# Patient Record
Sex: Male | Born: 1985
Health system: Southern US, Community
[De-identification: ages and names within clinical notes are randomized; demographics above are authoritative.]

## PROBLEM LIST (undated history)

## (undated) DIAGNOSIS — E039 Hypothyroidism, unspecified: Secondary | ICD-10-CM

## (undated) HISTORY — DX: Hypothyroidism, unspecified: E03.9

## (undated) HISTORY — PX: HYPOSPADIAS CORRECTION: SHX483

## (undated) HISTORY — PX: MASS EXCISION: SHX2000

## (undated) HISTORY — PX: APPENDECTOMY: SHX54

---

## 2009-04-25 ENCOUNTER — Ambulatory Visit: Payer: Self-pay | Admitting: Otolaryngology

## 2009-08-24 ENCOUNTER — Ambulatory Visit: Payer: Self-pay | Admitting: Family Medicine

## 2009-08-24 ENCOUNTER — Encounter: Admission: RE | Admit: 2009-08-24 | Discharge: 2009-08-24 | Payer: Self-pay | Admitting: Family Medicine

## 2009-08-24 DIAGNOSIS — E039 Hypothyroidism, unspecified: Secondary | ICD-10-CM | POA: Insufficient documentation

## 2009-08-24 DIAGNOSIS — R599 Enlarged lymph nodes, unspecified: Secondary | ICD-10-CM | POA: Insufficient documentation

## 2009-08-24 LAB — CONVERTED CEMR LAB
HCT: 45.5 % (ref 39.0–52.0)
Lymphs Abs: 1.2 10*3/uL (ref 0.7–4.0)
MCHC: 35 g/dL (ref 30.0–36.0)
MCV: 86.6 fL (ref 78.0–100.0)
Monocytes Absolute: 0.4 10*3/uL (ref 0.1–1.0)
Neutrophils Relative %: 56.1 % (ref 43.0–77.0)
RBC: 5.26 M/uL (ref 4.22–5.81)
RDW: 11.3 % — ABNORMAL LOW (ref 11.5–14.6)
WBC: 3.7 10*3/uL — ABNORMAL LOW (ref 4.5–10.5)

## 2013-03-30 ENCOUNTER — Encounter: Payer: Self-pay | Admitting: Family Medicine

## 2013-03-30 ENCOUNTER — Ambulatory Visit (INDEPENDENT_AMBULATORY_CARE_PROVIDER_SITE_OTHER): Payer: 59 | Admitting: Family Medicine

## 2013-03-30 VITALS — BP 140/90 | HR 95 | Temp 97.8°F | Ht 73.0 in | Wt 190.5 lb

## 2013-03-30 DIAGNOSIS — E039 Hypothyroidism, unspecified: Secondary | ICD-10-CM | POA: Insufficient documentation

## 2013-03-30 DIAGNOSIS — Z23 Encounter for immunization: Secondary | ICD-10-CM

## 2013-03-30 NOTE — Progress Notes (Signed)
Nature conservation officer at [facility] [address] [place] [address] Phone: [phone] Fax: [phone]  Date:  03/30/2013   Name:  Allen Long   DOB:  [birth year]   MRN:  [id number] Gender: male Age: 27 y.o.  Primary Physician:  Hannah Beat, MD  Evaluating MD: Hannah Beat, MD   Chief Complaint: Establish Care   History of Present Illness:  Srihaan Mastrangelo is a 27 y.o. pleasant patient who presents with the following:  06/14/2013 - baby is due.   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   Sharyl Nimrod is wife.  From [place]. [employer] undergrad - wife works there.   Dr. Colbert Ewing at [facility] has been following hypothyroidism.  Had radioactive iodine testing and normal.   Tdap - now  Thyroid has been stable  Patient Active Problem List   Diagnosis Date Noted  . HYPOTHYROIDISM 08/24/2009  . ENLARGEMENT OF LYMPH NODES 08/24/2009    No past medical history on file.  No past surgical history on file.  History   Social History  . Marital Status: Married    Spouse Name: N/A    Number of Children: N/A  . Years of Education: N/A   Occupational History  . Not on file.   Social History Main Topics  . Smoking status: Never Smoker   . Smokeless tobacco: Not on file  . Alcohol Use: No  . Drug Use: No  . Sexually Active: Not on file   Other Topics Concern  . Not on file   Social History Narrative  . No narrative on file    No family history on file.  Allergies  Allergen Reactions  . Penicillins     REACTION: Rash as a child    Medication list has been reviewed and updated.  No outpatient prescriptions prior to visit.   No facility-administered medications prior to visit.    Review of Systems:   GEN: No acute illnesses, no fevers, chills. GI: No n/v/d, eating normally Pulm: No SOB Interactive and getting along well at home.  Otherwise, ROS is as per the HPI.   Physical Examination: BP 140/90  Pulse 95  Temp(Src) 97.8 F (36.6 C) (Oral)  Ht 6\' 1"   (1.854 m)  Wt 190 lb 8 oz (86.41 kg)  BMI 25.14 kg/m2  SpO2 96%  Ideal Body Weight: Weight in (lb) to have BMI = 25: 189.1   GEN: WDWN, NAD, Non-toxic, A & O x 3 HEENT: Atraumatic, Normocephalic. Neck supple. No masses, No LAD. Ears and Nose: No external deformity. CV: RRR, No M/G/R. No JVD. No thrill. No extra heart sounds. PULM: CTA B, no wheezes, crackles, rhonchi. No retractions. No resp. distress. No accessory muscle use. EXTR: No c/c/e NEURO Normal gait.  PSYCH: Normally interactive. Conversant. Not depressed or anxious appearing.  Calm demeanor.    Assessment and Plan:  Immunization due - Plan: Tdap vaccine greater than or equal to 7yo IM  HYPOTHYROIDISM  Hypothyroid  Tdap topday O/w doing well  Orders Today:  Orders Placed This Encounter  Procedures  . Tdap vaccine greater than or equal to 7yo IM    Updated Medication List: (Includes new medications, updates to list, dose adjustments) Meds ordered this encounter  Medications  . levothyroxine (SYNTHROID, LEVOTHROID) 175 MCG tablet    Sig: Take 175 mcg by mouth daily before breakfast.  . Multiple Vitamin (MULTIVITAMIN) tablet    Sig: Take 1 tablet by mouth daily.  Marland Kitchen loratadine (CLARITIN) 10 MG tablet    Sig: Take  10 mg by mouth daily.    Medications Discontinued: There are no discontinued medications.    Signed, Elpidio Galea. Armend Hochstatter, MD 03/30/2013 3:57 PM

## 2014-01-09 ENCOUNTER — Encounter: Payer: Self-pay | Admitting: Family Medicine

## 2014-01-09 ENCOUNTER — Ambulatory Visit (INDEPENDENT_AMBULATORY_CARE_PROVIDER_SITE_OTHER): Payer: 59 | Admitting: Family Medicine

## 2014-01-09 VITALS — BP 118/78 | HR 75 | Temp 97.8°F | Wt 178.5 lb

## 2014-01-09 DIAGNOSIS — J189 Pneumonia, unspecified organism: Secondary | ICD-10-CM

## 2014-01-09 MED ORDER — AZITHROMYCIN 250 MG PO TABS: ORAL_TABLET | ORAL | Status: DC

## 2014-01-09 NOTE — Progress Notes (Signed)
   Date:  01/09/2014   Name:  Allen Long   DOB:  [birth year]   MRN:  [id number] Gender: male Age: 28 y.o.  Primary Physician:  Owens Loffler, MD   Chief Complaint: Cough   Subjective:   History of Present Illness:  Allen Long is a 28 y.o. very pleasant male patient who presents with the following:  Cough for about 2 weeks, cough but no fever, dry cough, almost like a tickle in his throat. Off and on sick for 2 months. Newborn in daycare.  AF now. Taking nyquil.  Other than that, has felt fine. Felt a little bit. Took some nyquil.  Dr. Daisy Blossom checking thyroid.   Past Medical History, Surgical History, Social History, Family History, Problem List, Medications, and Allergies have been reviewed and updated if relevant.  Review of Systems: ROS: GEN: Acute illness details above GI: Tolerating PO intake GU: maintaining adequate hydration and urination Pulm: No SOB Interactive and getting along well at home.  Otherwise, ROS is as per the HPI.   Objective:   Physical Examination: BP 118/78  Pulse 75  Temp(Src) 97.8 F (36.6 C) (Oral)  Wt 178 lb 8 oz (80.967 kg)  SpO2 93%   GEN: A and O x 3. WDWN. NAD.    ENT: Nose clear, ext NML.  No LAD.  No JVD.  TM's clear. Oropharynx clear.  PULM: Normal WOB, no distress. No crackles, wheezes, rhonchi. CV: RRR, no M/G/R, No rubs, No JVD.   EXT: warm and well-perfused, No c/c/e. PSYCH: Pleasant and conversant.   Laboratory and Imaging Data:  Assessment & Plan:   Walking pneumonia  discussed plan of care. Given length of symptoms and overall history, will treat with ABX in this case. Continue with additional supportive care, cough medications, liquids, sleep, steam / vaporizer.   New Prescriptions   AZITHROMYCIN (ZITHROMAX Z-PAK) 250 MG TABLET    Take 2 tablets (500 mg) on  Day 1,  followed by 1 tablet (250 mg) once daily on Days 2 through 5.   No orders of the defined types were placed in this encounter.    Signed,  Allen Long. Maida Widger, MD, [place] at [facility] [place] [address] Phone: [phone] Fax: [phone]  There are no Patient Instructions on file for this visit. Patient's Medications  New Prescriptions   AZITHROMYCIN (ZITHROMAX Z-PAK) 250 MG TABLET    Take 2 tablets (500 mg) on  Day 1,  followed by 1 tablet (250 mg) once daily on Days 2 through 5.  Previous Medications   LEVOTHYROXINE (SYNTHROID, LEVOTHROID) 175 MCG TABLET    Take 175 mcg by mouth daily before breakfast.   LORATADINE (CLARITIN) 10 MG TABLET    Take 10 mg by mouth daily.   MULTIPLE VITAMIN (MULTIVITAMIN) TABLET    Take 1 tablet by mouth daily.  Modified Medications   No medications on file  Discontinued Medications   No medications on file

## 2014-01-09 NOTE — Progress Notes (Signed)
Pre-visit discussion using our clinic review tool. No additional management support is needed unless otherwise documented below in the visit note.  

## 2014-01-30 ENCOUNTER — Encounter: Payer: Self-pay | Admitting: Family Medicine

## 2014-01-30 ENCOUNTER — Ambulatory Visit (INDEPENDENT_AMBULATORY_CARE_PROVIDER_SITE_OTHER): Payer: 59 | Admitting: Family Medicine

## 2014-01-30 ENCOUNTER — Ambulatory Visit (INDEPENDENT_AMBULATORY_CARE_PROVIDER_SITE_OTHER)
Admission: RE | Admit: 2014-01-30 | Discharge: 2014-01-30 | Disposition: A | Discharge status: Home / Self Care | Payer: 59 | Source: Ambulatory Visit | Attending: Family Medicine | Admitting: Family Medicine

## 2014-01-30 VITALS — BP 124/72 | HR 82 | Temp 97.8°F | Ht 73.0 in | Wt 178.5 lb

## 2014-01-30 DIAGNOSIS — M20019 Mallet finger of unspecified finger(s): Secondary | ICD-10-CM

## 2014-01-30 DIAGNOSIS — M20012 Mallet finger of left finger(s): Secondary | ICD-10-CM

## 2014-01-30 NOTE — Progress Notes (Signed)
Pre visit review using our clinic review tool, if applicable. No additional management support is needed unless otherwise documented below in the visit note. 

## 2014-01-30 NOTE — Progress Notes (Signed)
Date:  01/30/2014   Name:  Allen Long   DOB:  10/23/1986   MRN:  [id number]  Primary Physician:  Owens Loffler, MD   Chief Complaint: Finger Injury   Subjective:   History of Present Illness:  Allen Long is a 28 y.o. pleasant patient who presents with the following:  L 4th digit. Healthy young man was playing kickball yesterday, when he sustained an injury to his LEFT 4th digit. At that time a basketball traumatized the end of his finger, and he was no longer able to extend at the DIP joint, consistent with mallet deformity. He had a prior mallet deformity a couple of years ago, so he placed himself in a volar aluminum form splint. He is having some minimal pain only. He is here for evaluation and consideration of definitive management.  No fracture  Patient Active Problem List   Diagnosis Date Noted  . Hypothyroid     Past Medical History  Diagnosis Date  . Hypothyroid     No past surgical history on file.  History   Social History  . Marital Status: Married    Spouse Name: [name]    Number of Children: N/A  . 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 Smoking status: Never Smoker   . Smokeless tobacco: Never Used  . Alcohol Use: No  . Drug Use: No  . Sexual Activity: Yes    Partners: Female   Other Topics Concern  . 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       [name] is wife.    From [place].   [employer] undergrad - wife works there.     No family history on file.  Allergies  Allergen Reactions  . Penicillins     REACTION: Rash as a child    Medication list has been reviewed and updated.  Review of Systems:  GEN: No fevers, chills. Nontoxic. Primarily MSK c/o today. MSK: Detailed in the HPI GI: tolerating PO intake without difficulty Neuro: No numbness, parasthesias, or tingling associated. Otherwise the pertinent positives of the ROS are noted  above.   Objective:   Physical Examination: BP 124/72  Pulse 82  Temp(Src) 97.8 F (36.6 C) (Oral)  Ht 6\' 1"  (1.854 m)  Wt 178 lb 8 oz (80.967 kg)  BMI 23.56 kg/m2  Ideal Body Weight: Weight in (lb) to have BMI = 25: 189.1   GEN: WDWN, NAD, Non-toxic, Alert & Oriented x 3 HEENT: Atraumatic, Normocephalic.  Ears and Nose: No external deformity. EXTR: No clubbing/cyanosis/edema NEURO: Normal gait.  PSYCH: Normally interactive. Conversant. Not depressed or anxious appearing.  Calm demeanor.    Upon removal of the splint on the patient's 4th finger on the LEFT, there is an obvious mallet deformity on the DIPs. Flexor tendon function is preserved. There is no significant tenderness along the shaft of the finger. Nontender throughout all metacarpals and wrist. The distal radius and ulna are also nontender. There is no significant bruising or swelling.  Dg Finger Ring Left  01/30/2014   CLINICAL DATA:  Possible mallet injury of the finger, evaluate for fracture  EXAM: LEFT RING FINGER 2+V  COMPARISON:  None.  FINDINGS: No fracture or dislocation. Joint spaces are preserved. No erosions. There is possible mild soft tissue swelling about the PIP joint. No radiopaque foreign body.  IMPRESSION: Mild soft tissue swelling about  the PIP joint without associated fracture or radiopaque foreign body.   Electronically Signed   By: Sandi Mariscal M.D.   On: 01/30/2014 17:12    Assessment & Plan:   Mallet finger of left hand - Plan: DG Finger Ring Left  Consistent with tenderness mallet finger. No fracture. Immobilization for 6-8 weeks. Followup in 4 weeks.  He was placed in a stack splint without difficulty, and this was wrapped with athletic tape for fixation.  Orders Placed This Encounter  Procedures  . DG Finger Ring Left   Patient's Medications  New Prescriptions   No medications on file  Previous Medications   LEVOTHYROXINE (SYNTHROID, LEVOTHROID) 175 MCG TABLET    Take 175 mcg by mouth  daily before breakfast.   LORATADINE (CLARITIN) 10 MG TABLET    Take 10 mg by mouth daily.   MULTIPLE VITAMIN (MULTIVITAMIN) TABLET    Take 1 tablet by mouth daily.  Modified Medications   No medications on file  Discontinued Medications   AZITHROMYCIN (ZITHROMAX Z-PAK) 250 MG TABLET    Take 2 tablets (500 mg) on  Day 1,  followed by 1 tablet (250 mg) once daily on Days 2 through 5.   Patient Instructions  F/u 1 month   Signed,  Henretter Piekarski T. Asiah Befort, MD, [place] at [facility] [place] [address] Phone: [phone] Fax: [phone]

## 2014-01-30 NOTE — Patient Instructions (Signed)
F/u 1 month 

## 2014-02-24 ENCOUNTER — Telehealth: Payer: Self-pay

## 2014-02-24 NOTE — Telephone Encounter (Signed)
Pt left v/m; pt will be traveling to [place] in June; pt has appt already scheduled with Dr Lorelei Pont on 03/02/14 and pt wants to know if can get Hep A and typhoid med at that time. Pt request cb.

## 2014-02-27 NOTE — Telephone Encounter (Signed)
Called [name] and recommend that he go to the [facility] at [facility] to get vaccines he needs for his upcoming trip to [place].  Phone number [phone]) given to call and schedule appointment.

## 2014-02-27 NOTE — Telephone Encounter (Signed)
Butch Penny:  Hep a, no problem.   We will have to order Typhoid. Why don't we reschedule to next week to be sure it is in.    Dee, can you order him a Typhoid vaccine.  Owens Loffler, MD

## 2014-03-02 ENCOUNTER — Encounter: Payer: Self-pay | Admitting: Family Medicine

## 2014-03-02 ENCOUNTER — Ambulatory Visit (INDEPENDENT_AMBULATORY_CARE_PROVIDER_SITE_OTHER): Payer: 59 | Admitting: Family Medicine

## 2014-03-02 VITALS — BP 138/82 | HR 64 | Temp 97.5°F | Ht 73.0 in | Wt 177.5 lb

## 2014-03-02 DIAGNOSIS — M20019 Mallet finger of unspecified finger(s): Secondary | ICD-10-CM

## 2014-03-02 DIAGNOSIS — Z23 Encounter for immunization: Secondary | ICD-10-CM

## 2014-03-02 DIAGNOSIS — M20012 Mallet finger of left finger(s): Secondary | ICD-10-CM

## 2014-03-02 NOTE — Progress Notes (Signed)
Pre visit review using our clinic review tool, if applicable. No additional management support is needed unless otherwise documented below in the visit note. 

## 2014-03-02 NOTE — Progress Notes (Signed)
Date:  03/02/2014   Name:  Allen Long   DOB:  04/25/1986   MRN:  [id number]  Primary Physician:  Owens Loffler, MD   Chief Complaint: Follow-up and Immunizations   Subjective:   History of Present Illness:  Allen Long is a 28 y.o. pleasant patient who presents with the following:  4 week f/u, wearing aluminum splint and doing well. No complications.  01/30/2014 Last OV with Owens Loffler, MD  L 4th digit. Healthy young man was playing kickball yesterday, when he sustained an injury to his LEFT 4th digit. At that time a basketball traumatized the end of his finger, and he was no longer able to extend at the DIP joint, consistent with mallet deformity. He had a prior mallet deformity a couple of years ago, so he placed himself in a volar aluminum form splint. He is having some minimal pain only. He is here for evaluation and consideration of definitive management.  No fracture  Patient Active Problem List   Diagnosis Date Noted  . Hypothyroid     Past Medical History  Diagnosis Date  . Hypothyroid     No past surgical history on file.  History   Social History  . Marital Status: Married    Spouse Name: [name]    Number of Children: N/A  . 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 Smoking status: Never Smoker   . Smokeless tobacco: Never Used  . Alcohol Use: No  . Drug Use: No  . Sexual Activity: Yes    Partners: Female   Other Topics Concern  . 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       [name] is wife.    From [place].   [employer] undergrad - wife works there.     No family history on file.  Allergies  Allergen Reactions  . Penicillins     REACTION: Rash as a child    Medication list has been reviewed and updated.  Review of Systems:  GEN: No fevers, chills. Nontoxic. Primarily MSK c/o today. MSK: Detailed in the HPI GI: tolerating PO intake  without difficulty Neuro: No numbness, parasthesias, or tingling associated. Otherwise the pertinent positives of the ROS are noted above.   Objective:   Physical Examination: BP 138/82  Pulse 64  Temp(Src) 97.5 F (36.4 C) (Oral)  Ht 6\' 1"  (1.854 m)  Wt 177 lb 8 oz (80.513 kg)  BMI 23.42 kg/m2  SpO2 98%  Ideal Body Weight: Weight in (lb) to have BMI = 25: 189.1   GEN: WDWN, NAD, Non-toxic, Alert & Oriented x 3 HEENT: Atraumatic, Normocephalic.  Ears and Nose: No external deformity. EXTR: No clubbing/cyanosis/edema NEURO: Normal gait.  PSYCH: Normally interactive. Conversant. Not depressed or anxious appearing.  Calm demeanor.    Upon removal of the splint on the patient's 4th finger on the LEFT - nontender now. There is no significant bruising or swelling.  Dg Finger Ring Left  01/30/2014   CLINICAL DATA:  Possible mallet injury of the finger, evaluate for fracture  EXAM: LEFT RING FINGER 2+V  COMPARISON:  None.  FINDINGS: No fracture or dislocation. Joint spaces are preserved. No erosions. There is possible mild soft tissue swelling about the PIP joint. No radiopaque foreign body.  IMPRESSION: Mild soft tissue swelling about the PIP joint without associated fracture or radiopaque foreign body.  Electronically Signed   By: Sandi Mariscal M.D.   On: 01/30/2014 17:12    Assessment & Plan:   Mallet finger of left hand  Need for hepatitis A immunization - Plan: Hepatitis A vaccine adult IM  Consistent with healing mallet finger. No fracture. Immobilization for 8 weeks.  4 weeks more, then remove splint.  Orders Placed This Encounter  Procedures  . Hepatitis A vaccine adult IM   Patient's Medications  New Prescriptions   No medications on file  Previous Medications   LEVOTHYROXINE (SYNTHROID, LEVOTHROID) 175 MCG TABLET    Take 175 mcg by mouth daily before breakfast.   LORATADINE (CLARITIN) 10 MG TABLET    Take 10 mg by mouth daily.   MULTIPLE VITAMIN (MULTIVITAMIN)  TABLET    Take 1 tablet by mouth daily.  Modified Medications   No medications on file  Discontinued Medications   No medications on file   There are no Patient Instructions on file for this visit.  Signed,  Maud Deed. Nataliyah Packham, MD, [place] at [facility] [place] [address] Phone: [phone] Fax: [phone]

## 2014-06-19 ENCOUNTER — Telehealth: Payer: Self-pay

## 2014-06-19 NOTE — Telephone Encounter (Signed)
Pt left v/m; pt had baby last year and baby tested positive for CF. Turned out baby did not have CF but pt wants to get genetic testing to see if would be issue going forward. Pt request an order for inherit test carrier screeninig ;pt is employee with [employer]. Pt would like to have test done at [facility].pt request cb.

## 2014-06-20 NOTE — Telephone Encounter (Signed)
Cystic Fibrosis (CF) Profile, 32 Mutations, DNA Analysis IS WHAT WAS RECCOMENDED I ALSO HAVE A FORM THEY NEED TO FILL OUT. BOTH PARENTS NEED TO DO THE TESTING.

## 2014-06-20 NOTE — Telephone Encounter (Signed)
I will look at this in the AM.   Not simple lab order.

## 2014-06-20 NOTE — Telephone Encounter (Signed)
Terri, can you find the proper [employer] test for CF gene testing as above? We may have to call [employer] to double check.

## 2014-06-21 NOTE — Telephone Encounter (Signed)
I can either give the patient an order, and he can have it done at [place] draw site, or he can come here.  There is a one-page paperwork that he needs to fill out. It is also recommended that his wife get CF testing at the same time by her doctor.  Either way ok. If comes here, we can set up a lab appt.

## 2014-06-22 ENCOUNTER — Telehealth: Payer: Self-pay | Admitting: Family Medicine

## 2014-06-22 NOTE — Telephone Encounter (Signed)
Returned [employer].  See previous phone note.

## 2014-06-22 NOTE — Telephone Encounter (Signed)
Ashten notified as instructed by telephone.  He states his wife has already been tested.  He would like to come here to have test drawn.  Appointment scheduled for lab on 06/27/2014 @ 11:45am.

## 2014-06-22 NOTE — Telephone Encounter (Signed)
Pt is returning your call. Please call him back. Thank you.

## 2014-06-27 ENCOUNTER — Other Ambulatory Visit (INDEPENDENT_AMBULATORY_CARE_PROVIDER_SITE_OTHER): Payer: 59

## 2014-06-27 DIAGNOSIS — Z13228 Encounter for screening for other metabolic disorders: Secondary | ICD-10-CM

## 2014-07-02 NOTE — Progress Notes (Signed)
Did this ever get done?

## 2014-07-11 LAB — INHERITEST CARRIER SCREEN: PDF: 0

## 2014-07-12 ENCOUNTER — Telehealth: Payer: Self-pay | Admitting: Family Medicine

## 2014-07-12 NOTE — Telephone Encounter (Signed)
Patient returned your call.

## 2014-07-12 NOTE — Telephone Encounter (Signed)
Allen Long notified of lab results by telephone.  See Result Notes.

## 2014-07-20 ENCOUNTER — Encounter: Payer: Self-pay | Admitting: Family Medicine

## 2014-08-31 ENCOUNTER — Ambulatory Visit: Payer: 59

## 2014-09-05 ENCOUNTER — Ambulatory Visit (INDEPENDENT_AMBULATORY_CARE_PROVIDER_SITE_OTHER): Payer: 59

## 2014-09-05 DIAGNOSIS — Z23 Encounter for immunization: Secondary | ICD-10-CM

## 2015-01-08 ENCOUNTER — Ambulatory Visit (INDEPENDENT_AMBULATORY_CARE_PROVIDER_SITE_OTHER): Payer: 59 | Admitting: Internal Medicine

## 2015-01-08 ENCOUNTER — Encounter: Payer: Self-pay | Admitting: Internal Medicine

## 2015-01-08 VITALS — BP 126/78 | HR 68 | Temp 98.0°F | Wt 181.5 lb

## 2015-01-08 DIAGNOSIS — J029 Acute pharyngitis, unspecified: Secondary | ICD-10-CM

## 2015-01-08 DIAGNOSIS — J020 Streptococcal pharyngitis: Secondary | ICD-10-CM

## 2015-01-08 MED ORDER — CLINDAMYCIN HCL 300 MG PO CAPS: 300.0000 mg | ORAL_CAPSULE | Freq: Three times a day (TID) | ORAL | Status: DC

## 2015-01-08 NOTE — Progress Notes (Signed)
Subjective:    Patient ID: Allen Long, male    DOB: November 12, 1986, 29 y.o.   MRN: [id number]  HPI  Pt presents to the clinic today with c/o sore throat. He reports this started 3 days ago. He has had difficulty swallowing. He denies fever, chills or body aches. He has tried warm salt water gargles, Claritin and Dayquil with some relief. He has no history of allergies or breathing problems. He has had contacts with similar symptoms.  Review of Systems      Past Medical History  Diagnosis Date  . Hypothyroid     Current Outpatient Prescriptions  Medication Sig Dispense Refill  . levothyroxine (SYNTHROID, LEVOTHROID) 175 MCG tablet Take 175 mcg by mouth daily before breakfast.    . loratadine (CLARITIN) 10 MG tablet Take 10 mg by mouth daily.    . Multiple Vitamin (MULTIVITAMIN) tablet Take 1 tablet by mouth daily.     No current facility-administered medications for this visit.    Allergies  Allergen Reactions  . Penicillins     REACTION: Rash as a child    History reviewed. No pertinent family history.  History   Social History  . Marital Status: Married    Spouse Name: [name]  . Number of Children: N/A  . 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 Smoking status: Never Smoker   . Smokeless tobacco: Never Used  . Alcohol Use: No  . Drug Use: No  . Sexual Activity:    Partners: Female   Other Topics Concern  . 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       [name] is wife.    From [place].   [employer] undergrad - wife works there.      Constitutional: Denies fever, malaise, fatigue, headache or abrupt weight changes.  HEENT: Pt reports sore throat. Denies eye pain, eye redness, ear pain, ringing in the ears, wax buildup, runny nose, nasal congestion, bloody nose. Respiratory: Denies difficulty breathing, shortness of breath, cough or sputum production.     Cardiovascular: Denies chest pain, chest tightness, palpitations or swelling in the hands or feet.  Gastrointestinal: Denies abdominal pain, bloating, constipation, diarrhea or blood in the stool.    No other specific complaints in a complete review of systems (except as listed in HPI above).  Objective:   Physical Exam    BP 126/78 mmHg  Pulse 68  Temp(Src) 98 F (36.7 C) (Oral)  Wt 181 lb 8 oz (82.328 kg)  SpO2 98% Wt Readings from Last 3 Encounters:  01/08/15 181 lb 8 oz (82.328 kg)  03/02/14 177 lb 8 oz (80.513 kg)  01/30/14 178 lb 8 oz (80.967 kg)    General: Appears his stated age, well developed, well nourished in NAD. Skin: Warm, dry and intact. No rashes, lesions or ulcerations noted. HEENT: Head: normal shape and size; Ears: Tm's gray and intact, normal light reflex; Throat/Mouth: Teeth present, mucosa erythematous and moist, no exudate, lesions or ulcerations noted.  Neck: No adenopathy noted. Cardiovascular: Normal rate and rhythm. S1,S2 noted.  No murmur, rubs or gallops noted.  Pulmonary/Chest: Normal effort and positive vesicular breath sounds. No respiratory distress. No wheezes, rales or ronchi noted.   CBC    Component Value Date/Time   WBC 3.7* 08/24/2009 1009   RBC 5.26 08/24/2009 1009   HGB 15.9 08/24/2009 1009  HCT 45.5 08/24/2009 1009   PLT 180.0 08/24/2009 1009   MCV 86.6 08/24/2009 1009   MCHC 35.0 08/24/2009 1009   RDW 11.3* 08/24/2009 1009   LYMPHSABS 1.2 08/24/2009 1009   MONOABS 0.4 08/24/2009 1009   EOSABS 0.1 08/24/2009 1009   BASOSABS 0.0 08/24/2009 1009        Assessment & Plan:   Sore Throat:  RST: Positive Allergy to PCN, RX given for Clindamycin 300 mg TID x 10 days Ibuprofen and salt water gargles for the sore throat  RTC as needed or if symptoms persist or worsen

## 2015-01-08 NOTE — Progress Notes (Signed)
   Subjective:    Patient ID: Allen Long, male    DOB: 1986-04-22, 29 y.o.   MRN: [id number]  Sore Throat  Pertinent negatives include no congestion, coughing, neck pain or shortness of breath.   Allen Long is a 29 year old male who presents today with chief complaint of sore throat.  Symptoms began 3 days ago and says he has had some difficulty swallowing. He has tried OTC Dayquil and Claritin with some relief. He denies cough, post nasal drip, congestion, and fever.  No chills or malaise.  His wife is sick with the same symptoms as well.    Review of Systems  Constitutional: Negative for fever, chills and fatigue.  HENT: Negative for congestion, postnasal drip and rhinorrhea.   Respiratory: Negative for cough, shortness of breath and wheezing.   Cardiovascular: Negative for chest pain, palpitations and leg swelling.  Musculoskeletal: Negative for arthralgias, neck pain and neck stiffness.  Skin: Negative for color change, pallor, rash and wound.   Past Medical History  Diagnosis Date  . Hypothyroid    History reviewed. No pertinent family history. Current Outpatient Prescriptions on File Prior to Visit  Medication Sig Dispense Refill  . levothyroxine (SYNTHROID, LEVOTHROID) 175 MCG tablet Take 175 mcg by mouth daily before breakfast.    . loratadine (CLARITIN) 10 MG tablet Take 10 mg by mouth daily.    . Multiple Vitamin (MULTIVITAMIN) tablet Take 1 tablet by mouth daily.     No current facility-administered medications on file prior to visit.       Objective:   Physical Exam  Constitutional: He is oriented to person, place, and time. He appears well-developed and well-nourished.  HENT:  Head: Normocephalic and atraumatic.  Mouth/Throat: Posterior oropharyngeal erythema present.  Tonsils are 2+ and erythematous.   Neck: Normal range of motion. Neck supple.  Cardiovascular: Normal rate, regular rhythm and normal heart sounds.   No murmur heard. Pulmonary/Chest: Effort  normal and breath sounds normal. He has no wheezes.  Musculoskeletal: Normal range of motion.  Lymphadenopathy:    He has no cervical adenopathy.  Neurological: He is alert and oriented to person, place, and time.  Skin: Skin is warm and dry.  Psychiatric: He has a normal mood and affect.    BP 126/78 mmHg  Pulse 68  Temp(Src) 98 F (36.7 C) (Oral)  Wt 181 lb 8 oz (82.328 kg)  SpO2 98%       Assessment & Plan:  1. Strep pharyngitis - In office strep test is positive.  Will Rx for Clindamycin 300 mg po tid for 10 days.  Advised patient to complete entire regimen of antibiotics and not to stop early.  Call office if symptoms worsen or do not improve.

## 2015-01-08 NOTE — Progress Notes (Signed)
Pre visit review using our clinic review tool, if applicable. No additional management support is needed unless otherwise documented below in the visit note. 

## 2015-01-08 NOTE — Patient Instructions (Signed)

## 2015-01-09 LAB — POCT RAPID STREP A (OFFICE): Rapid Strep A Screen: POSITIVE — AB

## 2015-01-09 NOTE — Addendum Note (Signed)
Addended by: Lurlean Nanny on: 01/09/2015 08:19 AM   Modules accepted: Orders

## 2015-07-09 ENCOUNTER — Ambulatory Visit (HOSPITAL_COMMUNITY)
Admission: RE | Admit: 2015-07-09 | Discharge: 2015-07-09 | Disposition: A | Discharge status: Home / Self Care | Payer: 59 | Source: Ambulatory Visit | Attending: Obstetrics & Gynecology | Admitting: Obstetrics & Gynecology

## 2015-07-09 DIAGNOSIS — Z1379 Encounter for other screening for genetic and chromosomal anomalies: Secondary | ICD-10-CM | POA: Diagnosis not present

## 2015-07-20 ENCOUNTER — Other Ambulatory Visit (HOSPITAL_COMMUNITY): Payer: Self-pay | Admitting: Obstetrics & Gynecology

## 2015-08-22 ENCOUNTER — Ambulatory Visit (INDEPENDENT_AMBULATORY_CARE_PROVIDER_SITE_OTHER): Payer: 59 | Admitting: Internal Medicine

## 2015-08-22 ENCOUNTER — Encounter: Payer: Self-pay | Admitting: Internal Medicine

## 2015-08-22 VITALS — BP 134/82 | HR 70 | Temp 98.1°F | Wt 182.0 lb

## 2015-08-22 DIAGNOSIS — R0982 Postnasal drip: Secondary | ICD-10-CM

## 2015-08-22 DIAGNOSIS — R05 Cough: Secondary | ICD-10-CM | POA: Diagnosis not present

## 2015-08-22 DIAGNOSIS — R059 Cough, unspecified: Secondary | ICD-10-CM

## 2015-08-22 NOTE — Patient Instructions (Signed)
Allergic Rhinitis Allergic rhinitis is when the mucous membranes in the nose respond to allergens. Allergens are particles in the air that cause your body to have an allergic reaction. This causes you to release allergic antibodies. Through a chain of events, these eventually cause you to release histamine into the blood stream. Although meant to protect the body, it is this release of histamine that causes your discomfort, such as frequent sneezing, congestion, and an itchy, runny nose.  CAUSES Seasonal allergic rhinitis (hay fever) is caused by pollen allergens that may come from grasses, trees, and weeds. Year-round allergic rhinitis (perennial allergic rhinitis) is caused by allergens such as house dust mites, pet dander, and mold spores. SYMPTOMS  Nasal stuffiness (congestion).  Itchy, runny nose with sneezing and tearing of the eyes. DIAGNOSIS Your health care provider can help you determine the allergen or allergens that trigger your symptoms. If you and your health care provider are unable to determine the allergen, skin or blood testing may be used. Your health care provider will diagnose your condition after taking your health history and performing a physical exam. Your health care provider may assess you for other related conditions, such as asthma, pink eye, or an ear infection. TREATMENT Allergic rhinitis does not have a cure, but it can be controlled by:  Medicines that block allergy symptoms. These may include allergy shots, nasal sprays, and oral antihistamines.  Avoiding the allergen. Hay fever may often be treated with antihistamines in pill or nasal spray forms. Antihistamines block the effects of histamine. There are over-the-counter medicines that may help with nasal congestion and swelling around the eyes. Check with your health care provider before taking or giving this medicine. If avoiding the allergen or the medicine prescribed do not work, there are many new medicines  your health care provider can prescribe. Stronger medicine may be used if initial measures are ineffective. Desensitizing injections can be used if medicine and avoidance does not work. Desensitization is when a patient is given ongoing shots until the body becomes less sensitive to the allergen. Make sure you follow up with your health care provider if problems continue. HOME CARE INSTRUCTIONS It is not possible to completely avoid allergens, but you can reduce your symptoms by taking steps to limit your exposure to them. It helps to know exactly what you are allergic to so that you can avoid your specific triggers. SEEK MEDICAL CARE IF:  You have a fever.  You develop a cough that does not stop easily (persistent).  You have shortness of breath.  You start wheezing.  Symptoms interfere with normal daily activities.   This information is not intended to replace advice given to you by your health care provider. Make sure you discuss any questions you have with your health care provider.   Document Released: 07/29/2001 Document Revised: 11/24/2014 Document Reviewed: 07/11/2013 Elsevier Interactive Patient Education 2016 Elsevier Inc.  

## 2015-08-22 NOTE — Progress Notes (Signed)
HPI  Pt presents to the clinic today with c/o facial pressure and cough. This started 2 weeks ago. He denies runny nose, nasal congestion or sore throat. The cough is nonproductive. He denies fever, chills, body aches or shortness of breath. He has not tried anything OTC. He does have a history of allergies, and takes Claritin prn (but has not taken recently). He has had sick contacts.   Review of Systems      Past Medical History  Diagnosis Date  . Hypothyroid     No family history on file.  Social History   Social History  . Marital Status: Married    Spouse Name: [name]  . Number of Children: N/A  . 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 Smoking status: Never Smoker   . Smokeless tobacco: Never Used  . Alcohol Use: No  . Drug Use: No  . Sexual Activity:    Partners: Female   Other Topics Concern  . 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       [name] is wife.    From [place].   [employer] undergrad - wife works there.     Allergies  Allergen Reactions  . Penicillins     REACTION: Rash as a child     Constitutional: Denies headache, fatigue, fever or abrupt weight changes.  HEENT: Pt reports facial pain.  Denies eye redness, eye pain, pressure behind the eyes, nasal congestion, ear pain, ringing in the ears, wax buildup, runny nose or bloody nose. Respiratory: Positive cough. Denies difficulty breathing or shortness of breath.  Cardiovascular: Denies chest pain, chest tightness, palpitations or swelling in the hands or feet.   No other specific complaints in a complete review of systems (except as listed in HPI above).  Objective:   BP 134/82 mmHg  Pulse 70  Temp(Src) 98.1 F (36.7 C) (Oral)  Wt 182 lb (82.555 kg)  SpO2 98%  Wt Readings from Last 3 Encounters:  08/22/15 182 lb (82.555 kg)  01/08/15 181 lb 8 oz (82.328 kg)  03/02/14 177 lb 8 oz (80.513  kg)     General: Appears his stated age, well developed, well nourished in NAD. HEENT: Head: normal shape and size, no sinus tenderness noted; Eyes: sclera white, no icterus, conjunctiva pink; Ears: Tm's gray and intact, normal light reflex; Nose: mucosa pink and moist, septum midline; Throat/Mouth: + PND. Teeth present, mucosa pink and moist, no exudate noted, no lesions or ulcerations noted.  Neck: Cervical lymphadenopathy noted.  Cardiovascular: Normal rate and rhythm. S1,S2 noted.  No murmur, rubs or gallops noted.  Pulmonary/Chest: Normal effort and positive vesicular breath sounds. No respiratory distress. No wheezes, rales or ronchi noted.      Assessment & Plan:   Cough secondary to post nasal drip:  Start Zyrtec at night x 1 week Start Flonase in the morning x 1 week Delsym as needed for cough Return precautions given  RTC as needed or if symptoms persist.

## 2015-08-22 NOTE — Progress Notes (Signed)
Pre visit review using our clinic review tool, if applicable. No additional management support is needed unless otherwise documented below in the visit note. 

## 2015-09-26 ENCOUNTER — Ambulatory Visit (INDEPENDENT_AMBULATORY_CARE_PROVIDER_SITE_OTHER): Payer: 59 | Admitting: Family Medicine

## 2015-09-26 ENCOUNTER — Encounter: Payer: Self-pay | Admitting: Family Medicine

## 2015-09-26 VITALS — BP 138/86 | HR 80 | Temp 98.6°F | Ht 72.5 in | Wt 180.0 lb

## 2015-09-26 DIAGNOSIS — J189 Pneumonia, unspecified organism: Secondary | ICD-10-CM | POA: Diagnosis not present

## 2015-09-26 MED ORDER — AZITHROMYCIN 250 MG PO TABS: ORAL_TABLET | ORAL | Status: DC

## 2015-09-26 NOTE — Progress Notes (Signed)
Dr. Frederico Hamman T. Bodin Gorka, MD, [facility] [place], [address] Phone: [phone] Fax: [phone]  09/26/2015  Patient: Allen Long, MRN: [id number], DOB: 06-16-1986, 29 y.o.  Primary Physician:  Owens Loffler, MD   Chief Complaint  Patient presents with  . Cough    x 1 month   Subjective:   Allen Long is a 29 y.o. very pleasant male patient who presents with the following:  Patient presents for presents evaluation of cough x 4 week ago and are gradually worsening since that time.    Coughing for over a month.  2-3 weeks ago, tried some allergy medication, and tht did not help.  Tried some nasonex in the morning.   Has had some wheeze and not able to fully clear it.   Risk Factors:  The patient denies significant nausea, vomitting, diarrhea, rash, diffuse arthralgia or myalgia. They also deny high fever.   Past Medical History, Surgical History, Social History, Family History, Problem List, Medications, and Allergies have been reviewed and updated if relevant.  Patient Active Problem List   Diagnosis Date Noted  . Hypothyroid     Past Medical History  Diagnosis Date  . Hypothyroid     No past surgical history on file.  Social History   Social History  . Marital Status: Married    Spouse Name: Allen Long  . Number of Children: N/A  . 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 Smoking status: Never Smoker   . Smokeless tobacco: Never Used  . Alcohol Use: No  . Drug Use: No  . Sexual Activity:    Partners: Female   Other Topics Concern  . 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       Allen Long is wife.    From [place].   [employer] undergrad - wife works there.     No family history on file.  Allergies  Allergen Reactions  . Penicillins     REACTION: Rash as a child    Medication list  reviewed and updated in full in [facility].  ROS: GEN: Acute illness details above GI: Tolerating PO intake GU: maintaining adequate hydration and urination Pulm: No SOB Interactive and getting along well at home.  Otherwise, ROS is as per the HPI.   Objective:   BP 138/86 mmHg  Pulse 80  Temp(Src) 98.6 F (37 C) (Oral)  Ht 6' 0.5" (1.842 m)  Wt 180 lb (81.647 kg)  BMI 24.06 kg/m2  SpO2 97%   GEN: A and O x 3. WDWN. NAD.    ENT: Nose clear, ext NML.  No LAD.  No JVD.  TM's clear. Oropharynx clear.  PULM: Normal WOB, no distress. No crackles, wheezes, rhonchi. CV: RRR, no M/G/R, No rubs, No JVD.   EXT: warm and well-perfused, No c/c/e. PSYCH: Pleasant and conversant.   Laboratory and Imaging Data: No results found.  Assessment and Plan:   Walking pneumonia  At this point, we reviewed supportive care. Given the length of time and risk factors, treat with medications below. 1 mo, more likely atypical, cannot exclude pertussis  Medical decision making includes all plans, orders, medications, and patient instructions reviewed face to face.   Follow-up: No Follow-up on file.  New Prescriptions   AZITHROMYCIN (ZITHROMAX) 250 MG TABLET  2 tabs po on day 1, then 1 tab po for 4 days   Modified Medications   No medications on file   No orders of the defined types were placed in this encounter.    Signed,  Maud Deed. Zanaya Baize, MD   Patient's Medications  New Prescriptions   AZITHROMYCIN (ZITHROMAX) 250 MG TABLET    2 tabs po on day 1, then 1 tab po for 4 days  Previous Medications   LEVOTHYROXINE (SYNTHROID, LEVOTHROID) 175 MCG TABLET    Take 175 mcg by mouth daily before breakfast.   LORATADINE (CLARITIN) 10 MG TABLET    Take 10 mg by mouth daily.   MULTIPLE VITAMIN (MULTIVITAMIN) TABLET    Take 1 tablet by mouth daily.  Modified Medications   No medications on file  Discontinued Medications   No medications on file

## 2015-09-26 NOTE — Progress Notes (Signed)
Pre visit review using our clinic review tool, if applicable. No additional management support is needed unless otherwise documented below in the visit note. 

## 2016-02-11 ENCOUNTER — Other Ambulatory Visit: Payer: Self-pay

## 2016-02-12 LAB — TSH+T4F+T3FREE
FREE T4: 1.68 ng/dL (ref 0.82–1.77)
T3, Free: 3.2 pg/mL (ref 2.0–4.4)
TSH: 1.61 u[IU]/mL (ref 0.450–4.500)

## 2016-07-25 DIAGNOSIS — E039 Hypothyroidism, unspecified: Secondary | ICD-10-CM | POA: Diagnosis not present

## 2016-07-30 DIAGNOSIS — E039 Hypothyroidism, unspecified: Secondary | ICD-10-CM | POA: Diagnosis not present

## 2016-07-30 DIAGNOSIS — E041 Nontoxic single thyroid nodule: Secondary | ICD-10-CM | POA: Diagnosis not present

## 2016-09-25 DIAGNOSIS — Z1283 Encounter for screening for malignant neoplasm of skin: Secondary | ICD-10-CM | POA: Diagnosis not present

## 2016-09-25 DIAGNOSIS — D1800 Hemangioma unspecified site: Secondary | ICD-10-CM | POA: Diagnosis not present

## 2016-09-25 DIAGNOSIS — D229 Melanocytic nevi, unspecified: Secondary | ICD-10-CM | POA: Diagnosis not present

## 2016-09-25 DIAGNOSIS — D485 Neoplasm of uncertain behavior of skin: Secondary | ICD-10-CM | POA: Diagnosis not present

## 2016-09-30 ENCOUNTER — Ambulatory Visit (INDEPENDENT_AMBULATORY_CARE_PROVIDER_SITE_OTHER): Payer: BLUE CROSS/BLUE SHIELD | Admitting: Primary Care

## 2016-09-30 ENCOUNTER — Encounter: Payer: Self-pay | Admitting: Primary Care

## 2016-09-30 VITALS — BP 124/84 | HR 74 | Temp 98.1°F | Ht 72.5 in | Wt 183.1 lb

## 2016-09-30 DIAGNOSIS — J069 Acute upper respiratory infection, unspecified: Secondary | ICD-10-CM

## 2016-09-30 MED ORDER — AZITHROMYCIN 250 MG PO TABS: ORAL_TABLET | ORAL | 0 refills | Status: DC

## 2016-09-30 MED ORDER — BENZONATATE 200 MG PO CAPS: 200.0000 mg | ORAL_CAPSULE | Freq: Three times a day (TID) | ORAL | 0 refills | Status: DC | PRN

## 2016-09-30 NOTE — Progress Notes (Signed)
Subjective:    Patient ID: Allen Long, male    DOB: 1986-01-30, 30 y.o.   MRN: [id number]  HPI  Ms. Archilla is a 30 year old male with a history of "walking pneumonia" who presents today with a chief complaint of cough. He also reports rhinorrhea, fatigue, congestion. His cough has been present for the past 2 weeks and is dry in nature. Over the past 24 hours he's beginning to feel worse. He denies fevers, nausea, sore throat. He's been around his 2 small children who've had similar symptoms but have recovered. He's taken Nyquil at night with tempoary improvement.   Review of Systems  Constitutional: Positive for chills and fatigue. Negative for fever.  HENT: Positive for congestion and sore throat. Negative for ear pain, postnasal drip and sinus pressure.   Respiratory: Positive for cough. Negative for shortness of breath and wheezing.   Cardiovascular: Negative for chest pain.       Past Medical History:  Diagnosis Date  . Hypothyroid      Social History   Social History  . Marital status: Married    Spouse name: [name]  . Number of children: N/A  . Years of education: N/A   Occupational History  . 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 Smoking status: Never Smoker  . Smokeless tobacco: Never Used  . Alcohol use No  . Drug use: No  . Sexual activity: Yes    Partners: Female   Other Topics Concern  . 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       [name] is wife.    From [place].   [employer] undergrad - wife works there.     No past surgical history on file.  No family history on file.  Allergies  Allergen Reactions  . Penicillins     REACTION: Rash as a child    Current Outpatient Prescriptions on File Prior to Visit  Medication Sig Dispense Refill  . levothyroxine (SYNTHROID, LEVOTHROID) 175 MCG tablet Take 175 mcg by mouth daily before breakfast.    . loratadine (CLARITIN) 10 MG tablet Take 10 mg by mouth  daily.    . Multiple Vitamin (MULTIVITAMIN) tablet Take 1 tablet by mouth daily.     No current facility-administered medications on file prior to visit.     BP 124/84   Pulse 74   Temp 98.1 F (36.7 C) (Oral)   Ht 6' 0.5" (1.842 m)   Wt 183 lb 1.9 oz (83.1 kg)   SpO2 97%   BMI 24.49 kg/m    Objective:   Physical Exam  Constitutional: He appears well-nourished. He appears ill.  HENT:  Right Ear: Tympanic membrane and ear canal normal.  Left Ear: Tympanic membrane and ear canal normal.  Nose: No mucosal edema. Right sinus exhibits no maxillary sinus tenderness and no frontal sinus tenderness. Left sinus exhibits no maxillary sinus tenderness and no frontal sinus tenderness.  Mouth/Throat: Oropharynx is clear and moist.  Eyes: Conjunctivae are normal.  Neck: Neck supple.  Cardiovascular: Normal rate and regular rhythm.   Pulmonary/Chest: Effort normal and breath sounds normal. He has no wheezes. He has no rales.  Skin: Skin is warm and dry.          Assessment & Plan:  URI:  Cough, congestion, fatigue x 2 weeks. Started feeling worse yesterday. Exam today with clear lungs but does appear acutely ill. Given duration  of symptoms, history of pneumonia, and presentation will treat. Rx for Zpak and Tessalon pearls sent to pharmacy for treatment.  Fluids, rest, follow up PRN.  Sheral Flow, NP

## 2016-09-30 NOTE — Progress Notes (Signed)
Pre visit review using our clinic review tool, if applicable. No additional management support is needed unless otherwise documented below in the visit note. 

## 2016-09-30 NOTE — Patient Instructions (Signed)
Start Azithromycin antibiotics. Take 2 tablets by mouth today, then 1 tablet daily for 4 additional days.  You may take Benzonatate capsules for cough. Take 1 capsule by mouth three times daily as needed for cough.  Continue Nyquil at bedtime to help with sleep.  Ensure you are staying hydrated with water to prevent dehydration.  It was a pleasure meeting you!

## 2016-10-01 ENCOUNTER — Ambulatory Visit: Payer: 59 | Admitting: Family Medicine

## 2017-01-21 DIAGNOSIS — M79605 Pain in left leg: Secondary | ICD-10-CM | POA: Diagnosis not present

## 2017-07-15 ENCOUNTER — Encounter: Payer: Self-pay | Admitting: Family Medicine

## 2017-07-15 ENCOUNTER — Ambulatory Visit (INDEPENDENT_AMBULATORY_CARE_PROVIDER_SITE_OTHER): Payer: BLUE CROSS/BLUE SHIELD | Admitting: Family Medicine

## 2017-07-15 VITALS — BP 124/84 | HR 81 | Temp 98.0°F | Ht 72.5 in | Wt 193.8 lb

## 2017-07-15 DIAGNOSIS — J029 Acute pharyngitis, unspecified: Secondary | ICD-10-CM | POA: Diagnosis not present

## 2017-07-15 LAB — POCT RAPID STREP A (OFFICE): Rapid Strep A Screen: NEGATIVE

## 2017-07-15 NOTE — Addendum Note (Signed)
Addended by: Jacqualin Combes on: 07/15/2017 06:11 PM   Modules accepted: Orders

## 2017-07-15 NOTE — Patient Instructions (Addendum)
You have acute viral pharyngitis. Push fluids and plenty of rest. May use ibuprofen for throat inflammation. Salt water gargles. Suck on cold things like popsicles or warm things like herbal teas (whichever soothes the throat better). Return if fever >101.5, worsening pain, or trouble opening/closing mouth, or hoarse voice. Good to see you today, call clinic with questions.

## 2017-07-15 NOTE — Progress Notes (Addendum)
BP 124/84   Pulse 81   Temp 98 F (36.7 C) (Oral)   Ht 6' 0.5" (1.842 m)   Wt 193 lb 12.8 oz (87.9 kg)   SpO2 97%   BMI 25.92 kg/m    CC: ST Subjective:    Patient ID: Allen Long, male    DOB: [birth year], 31 y.o.   MRN: [id number]  HPI: Allen Long is a 31 y.o. male presenting on 07/15/2017 for Sore Throat (noticed for 6 days)   6 d h/o sore throat, dry cough, some sweats last night with coughing spasm that woke him up. This morning throat more sore than it's been. Some PNDrainage.   No fevers/chills, ear or tooth pain, congestion, headache, dyspnea or wheezing.   Wife with sinus infection last week.  Ill children at home.  Non smoker No h/o asthma.  Hasn't tried any medicine for this other than dayquil this morning.  Takes claritin for allergies   No fatigue, heat or cold intolerance.   Relevant past medical, surgical, family and social history reviewed and updated as indicated. Interim medical history since our last visit reviewed. Allergies and medications reviewed and updated. Outpatient Medications Prior to Visit  Medication Sig Dispense Refill  . levothyroxine (SYNTHROID, LEVOTHROID) 175 MCG tablet Take 175 mcg by mouth daily before breakfast.    . loratadine (CLARITIN) 10 MG tablet Take 10 mg by mouth daily.    . Multiple Vitamin (MULTIVITAMIN) tablet Take 1 tablet by mouth daily.    Marland Kitchen azithromycin (ZITHROMAX) 250 MG tablet Take 2 tablets by mouth today, then 1 tablet daily for 4 additional days. 6 tablet 0  . benzonatate (TESSALON) 200 MG capsule Take 1 capsule (200 mg total) by mouth 3 (three) times daily as needed for cough. 30 capsule 0   No facility-administered medications prior to visit.      Per HPI unless specifically indicated in ROS section below Review of Systems     Objective:    BP 124/84   Pulse 81   Temp 98 F (36.7 C) (Oral)   Ht 6' 0.5" (1.842 m)   Wt 193 lb 12.8 oz (87.9 kg)   SpO2 97%   BMI 25.92 kg/m   Wt Readings from Last 3  Encounters:  07/15/17 193 lb 12.8 oz (87.9 kg)  09/30/16 183 lb 1.9 oz (83.1 kg)  09/26/15 180 lb (81.6 kg)    Physical Exam  Constitutional: He appears well-developed and well-nourished. No distress.  HENT:  Head: Normocephalic and atraumatic.  Right Ear: Hearing, tympanic membrane, external ear and ear canal normal.  Left Ear: Hearing, tympanic membrane, external ear and ear canal normal.  Nose: Nose normal. No mucosal edema or rhinorrhea. Right sinus exhibits no maxillary sinus tenderness and no frontal sinus tenderness. Left sinus exhibits no maxillary sinus tenderness and no frontal sinus tenderness.  Mouth/Throat: Uvula is midline and mucous membranes are normal. Posterior oropharyngeal edema and posterior oropharyngeal erythema present. No oropharyngeal exudate or tonsillar abscesses.  Raw erythematous oropharynx  Eyes: Pupils are equal, round, and reactive to light. Conjunctivae and EOM are normal. No scleral icterus.  Neck: Normal range of motion. Neck supple. No thyromegaly present.  Cardiovascular: Normal rate, regular rhythm, normal heart sounds and intact distal pulses.   No murmur heard. Pulmonary/Chest: Effort normal and breath sounds normal. No respiratory distress. He has no wheezes. He has no rales.  Lymphadenopathy:    He has no cervical adenopathy (none appreciated).  Skin: Skin is warm  and dry. No rash noted.  Nursing note and vitals reviewed.  Results for orders placed or performed in visit on 07/15/17  POCT rapid strep A  Result Value Ref Range   Rapid Strep A Screen Negative Negative      Assessment & Plan:   Problem List Items Addressed This Visit    Acute pharyngitis - Primary    RST negative rec supportive care as per instructions.  Update if not improving with treatment.        Other Visit Diagnoses    Sore throat       Relevant Orders   POCT rapid strep A (Completed)       Follow up plan: Return if symptoms worsen or fail to  improve.  Ria Bush, MD

## 2017-07-15 NOTE — Assessment & Plan Note (Signed)
RST negative rec supportive care as per instructions.  Update if not improving with treatment.

## 2017-07-24 DIAGNOSIS — E039 Hypothyroidism, unspecified: Secondary | ICD-10-CM | POA: Diagnosis not present

## 2017-07-30 DIAGNOSIS — E039 Hypothyroidism, unspecified: Secondary | ICD-10-CM | POA: Diagnosis not present

## 2017-09-01 ENCOUNTER — Encounter: Payer: Self-pay | Admitting: Primary Care

## 2017-09-01 ENCOUNTER — Ambulatory Visit: Payer: BLUE CROSS/BLUE SHIELD | Admitting: Family Medicine

## 2017-09-01 ENCOUNTER — Ambulatory Visit (INDEPENDENT_AMBULATORY_CARE_PROVIDER_SITE_OTHER): Payer: BLUE CROSS/BLUE SHIELD | Admitting: Primary Care

## 2017-09-01 VITALS — BP 142/84 | HR 84 | Temp 97.8°F | Ht 72.5 in | Wt 185.4 lb

## 2017-09-01 DIAGNOSIS — H9202 Otalgia, left ear: Secondary | ICD-10-CM

## 2017-09-01 NOTE — Patient Instructions (Signed)
Continue Claritin daily.  Try changing Flonase use. Instill 1 spray in each nostril twice daily.   Increase Ibuprofen to 600 mg three times daily. You may use tylenol in between doses if needed. Do not exceed 3000 mg of tylenol in 24 hours.  It was a pleasure meeting you!

## 2017-09-01 NOTE — Progress Notes (Signed)
Subjective:    Patient ID: Allen Long, male    DOB: 01-20-1986, 31 y.o.   MRN: [id number]  HPI  Allen Long is a 31 year old male who presents today with a chief complaint of ear pain. His pain is located to the left ear. He also reports headache. He denies sore throat, cough, dizziness, fevers. His symptoms began 2 days ago and have progressed since. He's taken Ibuprofen, Claritin, and Flonase intermittently with temporary improvement.   Review of Systems  Constitutional: Negative for fever.  HENT: Positive for ear pain. Negative for congestion, sinus pressure and sore throat.   Respiratory: Negative for cough.        Past Medical History:  Diagnosis Date  . Hypothyroid      Social History   Social History  . Marital status: Married    Spouse name: [name]  . Number of children: N/A  . Years of education: N/A   Occupational History  . 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 Smoking status: Never Smoker  . Smokeless tobacco: Never Used  . Alcohol use No  . Drug use: No  . Sexual activity: Yes    Partners: Female   Other Topics Concern  . 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       [name] is wife.    From [place].   [employer] undergrad - wife works there.     No past surgical history on file.  No family history on file.  Allergies  Allergen Reactions  . Penicillins     REACTION: Rash as a child    Current Outpatient Prescriptions on File Prior to Visit  Medication Sig Dispense Refill  . levothyroxine (SYNTHROID, LEVOTHROID) 175 MCG tablet Take 175 mcg by mouth daily before breakfast.    . loratadine (CLARITIN) 10 MG tablet Take 10 mg by mouth daily.    . Multiple Vitamin (MULTIVITAMIN) tablet Take 1 tablet by mouth daily.     No current facility-administered medications on file prior to visit.     BP (!) 142/84   Pulse 84   Temp 97.8 F (36.6 C) (Oral)   Ht 6' 0.5" (1.842 m)   Wt 185 lb 6.4 oz (84.1  kg)   SpO2 97%   BMI 24.80 kg/m    Objective:   Physical Exam  Constitutional: He appears well-nourished.  HENT:  Right Ear: Tympanic membrane and ear canal normal. Tympanic membrane is not erythematous and not bulging.  Left Ear: Tympanic membrane and ear canal normal. No foreign bodies. Tympanic membrane is not erythematous and not bulging.  Nose: No mucosal edema. Right sinus exhibits no maxillary sinus tenderness and no frontal sinus tenderness. Left sinus exhibits no maxillary sinus tenderness and no frontal sinus tenderness.  Mouth/Throat: Oropharynx is clear and moist.  Dullness to bilateral TM's.  Eyes: Conjunctivae are normal.  Neck: Neck supple.  Cardiovascular: Normal rate and regular rhythm.   Pulmonary/Chest: Effort normal and breath sounds normal. He has no wheezes. He has no rales.  Skin: Skin is warm and dry.          Assessment & Plan:  Otalgia:  Located to left ear x 2 days. No evidence of infection, suspect allergy involvement.  Will have him continue Claritin, change Flonase to BID dosing, increase Ibuprofen dose to 600 mg TID, alternate with tylenol if needed. He will update in a few days if no  improvement.  Sheral Flow, NP

## 2017-09-02 ENCOUNTER — Telehealth: Payer: Self-pay

## 2017-09-02 ENCOUNTER — Telehealth: Payer: Self-pay | Admitting: Family Medicine

## 2017-09-02 DIAGNOSIS — R519 Headache, unspecified: Secondary | ICD-10-CM

## 2017-09-02 DIAGNOSIS — R51 Headache: Principal | ICD-10-CM

## 2017-09-02 DIAGNOSIS — J3489 Other specified disorders of nose and nasal sinuses: Secondary | ICD-10-CM

## 2017-09-02 MED ORDER — PREDNISONE 10 MG PO TABS: ORAL_TABLET | ORAL | 0 refills | Status: DC

## 2017-09-02 NOTE — Telephone Encounter (Signed)
Spoken and notified patient of Kate's comments. Patient verbalized understanding. 

## 2017-09-02 NOTE — Telephone Encounter (Signed)
Rx for prednisone tablets sent to pharmacy for pressure and headaches.  Take three tablets for 2 days, then two tablets for 2 days, then one tablet for 2 days. Please have him refrain from use of Ibuprofen, Advil, naproxen/Aleve while on prednisone.

## 2017-09-02 NOTE — Telephone Encounter (Signed)
Any sensitivity to light or sound? No Any nausea? No Any visual changes? No  Patient is taking Excedrin and just taking it about 1 hour ago.  Patient stated that he feels it is more than just a migraine. He feels lots pressure in the sinus area, in his cheeks.  Last night was awful so patient called today to see if he can get some to relieve the sinus pressure.

## 2017-09-02 NOTE — Telephone Encounter (Signed)
°  Patient Name: Allen Long  DOB: [birth year]    Initial Comment Caller states was at the office yesterday, since Sunday slight ear pain, looked alright but increase Advil and Flonase. Now is having increase headaches to the point that he can't sleep.   Nurse Assessment  Nurse: Renie Ora, RN, Ashby Dawes Date/Time (Eastern Time): 09/02/2017 12:29:51 PM  Confirm and document reason for call. If symptomatic, describe symptoms. ---Caller states he was seen at the office yesterday for slight ear pain since Sunday. States the ear looked alright, but was instructed to increase Advil and Flonase since thought may be related to allergies. States now he is having increased headaches and pressure to the point that he can't sleep. States pain is about his eyebrows but moves around his head to cheeks, jaw, front of head, and back of head.  Does the patient have any new or worsening symptoms? ---Yes  Will a triage be completed? ---Yes  Related visit to physician within the last 2 weeks? ---Yes  Does the PT have any chronic conditions? (i.e. diabetes, asthma, etc.) ---Yes  List chronic conditions. ---hypothyroid  Is this a behavioral health or substance abuse call? ---No     Guidelines    Guideline Title Affirmed Question Affirmed Notes  Headache [1] MODERATE headache (e.g., interferes with normal activities) AND [2] present > 24 hours AND [3] unexplained (Exceptions: analgesics not tried, typical migraine, or headache part of viral illness)    Final Disposition User   See Physician within 24 Hours [place], RN, [place]    Comments  Rates pain right now a 5 out of 10.  Sees Dr. Donella Stade normally, but also sees Alma Friendly, NP  Nurse to call back patient with appt availability. Explained to patient and confirmed call back number.  Called pt for apt assistance, he does not want an apt he is requesting to speak to Dr. Carlis Abbott. He was just seen on Tuesday.  CALLER STATES HE WOULD LIKE TO SPEAK TO DR. CLARK  TODAY ABOUT HIS HEADACHE. IF SOMETHING CAN BE PRESCRIBED HE MIGHT NOT NEED HIS APT IN THE MORNING AT 10. HE WILL ANTICIPATE A CALL BACK    Referrals  REFERRED TO PCP OFFICE   Caller Disagree/Comply Comply  Caller Understands Yes  PreDisposition Call Doctor

## 2017-09-02 NOTE — Telephone Encounter (Signed)
Pt called wanting to talk to someone   About symptoms  [facility] to [facility]

## 2017-09-02 NOTE — Telephone Encounter (Signed)
Pt left v/m; pt seen 09/01/17;no ear pain now but last night unable to sleep due to severe h/a. The h/a pain is much stronger than when seen 09/01/17. Pt wants to know what else can be done. Pt request cb.

## 2017-09-02 NOTE — Telephone Encounter (Signed)
Already addressed. See phone note.

## 2017-09-02 NOTE — Telephone Encounter (Signed)
Any sensitivity to light or sound? Any nausea? Any visual changes? If the Ibuprofen isn't working he can try Excedrin Migraine for what sounds like a migraine. If no improvement then may need to be seen again for IM medication.

## 2017-09-03 ENCOUNTER — Ambulatory Visit (INDEPENDENT_AMBULATORY_CARE_PROVIDER_SITE_OTHER): Payer: BLUE CROSS/BLUE SHIELD | Admitting: Primary Care

## 2017-09-03 VITALS — BP 140/80 | HR 98 | Temp 98.3°F | Ht 72.5 in | Wt 185.4 lb

## 2017-09-03 DIAGNOSIS — G44019 Episodic cluster headache, not intractable: Secondary | ICD-10-CM

## 2017-09-03 MED ORDER — KETOROLAC TROMETHAMINE 60 MG/2ML IM SOLN
60.0000 mg | Freq: Once | INTRAMUSCULAR | Status: AC
Start: 2017-09-03 — End: 2017-09-03
  Administered 2017-09-03: 60 mg via INTRAMUSCULAR

## 2017-09-03 NOTE — Patient Instructions (Signed)
Resume your prednisone tonight.  You were provided with an injection of Toradol for pain/headache.  You may take Tylenol as needed for breakthrough headache.  Please update [place] if you develop visual changes, dizziness, vomiting, increased pain.  It was a pleasure to see you today!

## 2017-09-03 NOTE — Addendum Note (Signed)
Addended by: Jacqualin Combes on: 09/03/2017 10:38 AM   Modules accepted: Orders

## 2017-09-03 NOTE — Progress Notes (Signed)
Subjective:    Patient ID: Allen Long, male    DOB: Jan 29, 1986, 31 y.o.   MRN: [id number]  HPI  Allen Long is a 31 year old male who presents today with a chief complaint of sinus pressure and head pressure. He was last seen two days ago for a two day history of left sided ear pain and headache. He was encouraged to continue Claritin, start Flonase, and increase Ibuprofen dose.  He called in yesterday reporting increased sinus pressure and headaches, adamantly requesting prednisone as this had helped in the past. A prescription for low dose prednisone taper was sent to his pharmacy per his request.   He presents today reporting sinus pressure and headaches. Mostly located to the frontal lobe and behind his left eye. He's not slept in two days. He denies ear pain, sore throat, cough, phonophobia, nausea. He has noticed slight photophobia. He's taken Excedrin migraine once with temporary improvement but experienced a side effect of jitteriness. He's also taken Tylenol this morning with a 2 hour improvement.   Review of Systems  Constitutional: Negative for fever.  HENT: Positive for sinus pressure. Negative for congestion, ear pain and sore throat.   Eyes: Positive for photophobia. Negative for visual disturbance.  Respiratory: Negative for cough.   Neurological: Positive for headaches. Negative for dizziness.       Past Medical History:  Diagnosis Date  . Hypothyroid      Social History   Social History  . Marital status: Married    Spouse name: Allen Long  . Number of children: N/A  . Years of education: N/A   Occupational History  . 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 Smoking status: Never Smoker  . Smokeless tobacco: Never Used  . Alcohol use No  . Drug use: No  . Sexual activity: Yes    Partners: Female   Other Topics Concern  . 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       Allen Long is wife.    From [place].   [employer] undergrad - wife works there.     No past surgical history on file.  No family history on file.  Allergies  Allergen Reactions  . Penicillins     REACTION: Rash as a child    Current Outpatient Prescriptions on File Prior to Visit  Medication Sig Dispense Refill  . levothyroxine (SYNTHROID, LEVOTHROID) 175 MCG tablet Take 175 mcg by mouth daily before breakfast.    . loratadine (CLARITIN) 10 MG tablet Take 10 mg by mouth daily.    . Multiple Vitamin (MULTIVITAMIN) tablet Take 1 tablet by mouth daily.    . predniSONE (DELTASONE) 10 MG tablet Take three tablets for 2 days, then two tablets for 2 days, then one tablet for 2 days. 12 tablet 0   No current facility-administered medications on file prior to visit.     BP 140/80   Pulse 98   Temp 98.3 F (36.8 C) (Oral)   Ht 6' 0.5" (1.842 m)   Wt 185 lb 6.4 oz (84.1 kg)   SpO2 99%   BMI 24.80 kg/m    Objective:   Physical Exam  Constitutional: He appears well-nourished.  HENT:  Right Ear: Tympanic membrane and ear canal normal.  Left Ear: Tympanic membrane and ear canal normal.  Nose: No mucosal edema. Right sinus exhibits no maxillary sinus tenderness and no frontal sinus tenderness. Left sinus exhibits  frontal sinus tenderness. Left sinus exhibits no maxillary sinus tenderness.  Mouth/Throat: Oropharynx is clear and moist.  Eyes: Pupils are equal, round, and reactive to light. Conjunctivae and EOM are normal.  Neck: Neck supple.  Cardiovascular: Normal rate and regular rhythm.   Pulmonary/Chest: Effort normal and breath sounds normal. He has no wheezes. He has no rales.  Neurological: No cranial nerve deficit.  Skin: Skin is warm and dry.          Assessment & Plan:  Cluster Headache vs Allergic Rhinitis:  Symptoms today sound more like cluster headache. Exam without evidence of a respiratory process. Will treat with IM Toradol. Resume prednisone taper. Continue tylenol PRN. Fluids, rest, follow up  PRN.  Sheral Flow, NP

## 2017-09-04 ENCOUNTER — Encounter: Payer: Self-pay | Admitting: Primary Care

## 2017-09-04 NOTE — Telephone Encounter (Signed)
Pt left v/m; pt seen 09/01/17 and 09/03/17 and symptoms are still there; pt has already sent detailed email about symptoms and pt request today about pt getting abx before the weekend. Pt request cb [phone].

## 2017-09-06 DIAGNOSIS — J0140 Acute pansinusitis, unspecified: Secondary | ICD-10-CM | POA: Diagnosis not present

## 2017-09-06 DIAGNOSIS — J309 Allergic rhinitis, unspecified: Secondary | ICD-10-CM | POA: Diagnosis not present

## 2017-09-07 ENCOUNTER — Ambulatory Visit: Payer: Self-pay | Admitting: *Deleted

## 2017-09-07 ENCOUNTER — Emergency Department: Payer: BLUE CROSS/BLUE SHIELD

## 2017-09-07 ENCOUNTER — Telehealth: Payer: Self-pay

## 2017-09-07 ENCOUNTER — Encounter: Payer: Self-pay | Admitting: Emergency Medicine

## 2017-09-07 ENCOUNTER — Emergency Department
Admission: EM | Admit: 2017-09-07 | Discharge: 2017-09-07 | Disposition: A | Discharge status: Home / Self Care | Payer: BLUE CROSS/BLUE SHIELD | Attending: Emergency Medicine | Admitting: Emergency Medicine

## 2017-09-07 DIAGNOSIS — R51 Headache: Secondary | ICD-10-CM | POA: Diagnosis not present

## 2017-09-07 DIAGNOSIS — R519 Headache, unspecified: Secondary | ICD-10-CM

## 2017-09-07 DIAGNOSIS — R55 Syncope and collapse: Secondary | ICD-10-CM | POA: Diagnosis not present

## 2017-09-07 DIAGNOSIS — E039 Hypothyroidism, unspecified: Secondary | ICD-10-CM | POA: Diagnosis not present

## 2017-09-07 DIAGNOSIS — Z79899 Other long term (current) drug therapy: Secondary | ICD-10-CM | POA: Insufficient documentation

## 2017-09-07 LAB — CBC WITH DIFFERENTIAL/PLATELET
BASOS PCT: 0 %
Basophils Absolute: 0 10*3/uL (ref 0–0.1)
EOS ABS: 0 10*3/uL (ref 0–0.7)
Eosinophils Relative: 0 %
HCT: 44.5 % (ref 40.0–52.0)
HEMOGLOBIN: 15.4 g/dL (ref 13.0–18.0)
Lymphocytes Relative: 5 %
Lymphs Abs: 0.4 10*3/uL — ABNORMAL LOW (ref 1.0–3.6)
MCH: 29.3 pg (ref 26.0–34.0)
MCHC: 34.7 g/dL (ref 32.0–36.0)
MCV: 84.4 fL (ref 80.0–100.0)
Monocytes Absolute: 0.1 10*3/uL — ABNORMAL LOW (ref 0.2–1.0)
Monocytes Relative: 1 %
NEUTROS PCT: 94 %
Neutro Abs: 6.5 10*3/uL (ref 1.4–6.5)
Platelets: 269 10*3/uL (ref 150–440)
RBC: 5.27 MIL/uL (ref 4.40–5.90)
RDW: 11.9 % (ref 11.5–14.5)
WBC: 6.9 10*3/uL (ref 3.8–10.6)

## 2017-09-07 LAB — COMPREHENSIVE METABOLIC PANEL
ALBUMIN: 4.3 g/dL (ref 3.5–5.0)
ALK PHOS: 75 U/L (ref 38–126)
ALT: 35 U/L (ref 17–63)
ANION GAP: 11 (ref 5–15)
AST: 31 U/L (ref 15–41)
BUN: 13 mg/dL (ref 6–20)
CALCIUM: 9.6 mg/dL (ref 8.9–10.3)
CHLORIDE: 103 mmol/L (ref 101–111)
CO2: 25 mmol/L (ref 22–32)
CREATININE: 1.29 mg/dL — AB (ref 0.61–1.24)
GFR calc Af Amer: >60 mL/min (ref >60)
GFR calc non Af Amer: >60 mL/min (ref >60)
GLUCOSE: 112 mg/dL — AB (ref 65–99)
Potassium: 3.3 mmol/L — ABNORMAL LOW (ref 3.5–5.1)
SODIUM: 139 mmol/L (ref 135–145)
TOTAL PROTEIN: 8 g/dL (ref 6.5–8.1)
Total Bilirubin: 0.8 mg/dL (ref 0.3–1.2)

## 2017-09-07 LAB — GLUCOSE, CAPILLARY: Glucose-Capillary: 95 mg/dL (ref 65–99)

## 2017-09-07 MED ORDER — PROCHLORPERAZINE MALEATE 10 MG PO TABS: 10.0000 mg | ORAL_TABLET | Freq: Four times a day (QID) | ORAL | 0 refills | Status: DC | PRN

## 2017-09-07 MED ORDER — LORAZEPAM 2 MG/ML IJ SOLN
INTRAMUSCULAR | Status: AC
  Filled 2017-09-07: qty 1

## 2017-09-07 MED ORDER — DOXYCYCLINE HYCLATE 100 MG PO CAPS: 100.0000 mg | ORAL_CAPSULE | Freq: Two times a day (BID) | ORAL | 0 refills | Status: DC

## 2017-09-07 MED ORDER — PROCHLORPERAZINE EDISYLATE 5 MG/ML IJ SOLN
10.0000 mg | Freq: Once | INTRAMUSCULAR | Status: AC
  Administered 2017-09-07: 10 mg via INTRAVENOUS

## 2017-09-07 MED ORDER — PROCHLORPERAZINE EDISYLATE 5 MG/ML IJ SOLN
INTRAMUSCULAR | Status: AC
  Filled 2017-09-07: qty 2

## 2017-09-07 MED ORDER — NAPROXEN 500 MG PO TABS: 500.0000 mg | ORAL_TABLET | Freq: Two times a day (BID) | ORAL | 0 refills | Status: DC

## 2017-09-07 NOTE — ED Notes (Signed)
Pt awake and alert.   Family with pt  

## 2017-09-07 NOTE — Telephone Encounter (Signed)
   Reason for Disposition . [1] MODERATE headache (e.g., interferes with normal activities) AND [2] present > 24 hours AND [3] unexplained  (Exceptions: analgesics not tried, typical migraine, or headache part of viral illness)  Answer Assessment - Initial Assessment Questions 1. LOCATION: "Where does it hurt?"      Left temple and base of head 2. ONSET: "When did the headache start?" (Minutes, hours or days)      A week ago, multiple places 3. PATTERN: "Does the pain come and go, or has it been constant since it started?"     Comes and goes, after taking med 4. SEVERITY: "How bad is the pain?" and "What does it keep you from doing?"  (e.g., Scale 1-10; mild, moderate, or severe)   - MILD (1-3): doesn't interfere with normal activities    - MODERATE (4-7): interferes with normal activities or awakens from sleep    - SEVERE (8-10): excruciating pain, unable to do any normal activities        5 5. RECURRENT SYMPTOM: "Have you ever had headaches before?" If so, ask: "When was the last time?" and "What happened that time?"      Not before 6. CAUSE: "What do you think is causing the headache?"     Not sure 7. MIGRAINE: "Have you been diagnosed with migraine headaches?" If so, ask: "Is this headache similar?"      no 8. HEAD INJURY: "Has there been any recent injury to the head?"      no 9. OTHER SYMPTOMS: "Do you have any other symptoms?" (fever, stiff neck, eye pain, sore throat, cold symptoms)     no 10. PREGNANCY: "Is there any chance you are pregnant?" "When was your last menstrual period?"       n/a  Protocols used: HEADACHE-A-AH

## 2017-09-07 NOTE — Discharge Instructions (Signed)
Please seek medical attention for any high fevers, chest pain, shortness of breath, change in behavior, persistent vomiting, bloody stool or any other new or concerning symptoms.  

## 2017-09-07 NOTE — ED Notes (Signed)
Pt alert   Ambulates without diff.  Family with pt at discharge.  Iv's d/ced.

## 2017-09-07 NOTE — ED Provider Notes (Signed)
[facility] Emergency Department Provider Note   ____________________________________________   I have reviewed the triage vital signs and the nursing notes.   HISTORY  Chief Complaint Headache   History limited by: Not Limited   HPI Allen Long is a 31 y.o. male who presents to the emergency department today because of headache.   LOCATION:back of head DURATION:~ 9 days TIMING: constant SEVERITY: severe CONTEXT: patient has seen his primary care doctor for this. Was put on Advil and prednisone without any relief. No history of headaches or migraines. No trauma. ASSOCIATED SYMPTOMS: whilst patient was in waiting room passed out and had seizure like activity. No history of seizures.   Per medical record review patient has a history of hypothyroid, denies any recent change to his medications.  Past Medical History:  Diagnosis Date  . Hypothyroid     Patient Active Problem List   Diagnosis Date Noted  . Acute pharyngitis 07/15/2017  . Hypothyroid     History reviewed. No pertinent surgical history.  Prior to Admission medications   Medication Sig Start Date End Date Taking? Authorizing Provider  doxycycline (VIBRAMYCIN) 100 MG capsule Take 1 capsule (100 mg total) by mouth 2 (two) times daily. 09/07/17  Yes Copland, Frederico Hamman, MD  levothyroxine (SYNTHROID, LEVOTHROID) 175 MCG tablet Take 175 mcg by mouth daily before breakfast.   Yes [provider]  loratadine (CLARITIN) 10 MG tablet Take 10 mg by mouth daily.   Yes [provider]  Multiple Vitamin (MULTIVITAMIN) tablet Take 1 tablet by mouth daily.   Yes [provider]  predniSONE (DELTASONE) 10 MG tablet Take three tablets for 2 days, then two tablets for 2 days, then one tablet for 2 days. 09/02/17  Yes Pleas Koch, NP    Allergies Penicillins  No family history on file.  Social History Social History  Substance Use Topics  . Smoking status: Never  Smoker  . Smokeless tobacco: Never Used  . Alcohol use No    Review of Systems Constitutional: No fever/chills Eyes: No visual changes. ENT: No sore throat. Cardiovascular: Denies chest pain. Respiratory: Denies shortness of breath. Gastrointestinal: No abdominal pain.  No nausea, no vomiting.  No diarrhea.   Genitourinary: Negative for dysuria. Musculoskeletal: Negative for back pain. Skin: Negative for rash. Neurological: Positive for headache.   ____________________________________________   PHYSICAL EXAM:  VITAL SIGNS: ED Triage Vitals  Enc Vitals Group     BP 09/07/17 1506 (!) 145/96     Pulse Rate 09/07/17 1506 (!) 110     Resp 09/07/17 1506 20     Temp 09/07/17 1506 (!) 97.5 F (36.4 C)     Temp Source 09/07/17 1506 Oral     SpO2 09/07/17 1506 100 %     Weight 09/07/17 1510 185 lb (83.9 kg)     Height 09/07/17 1510 6\' 1"  (1.854 m)     Head Circumference --      Peak Flow --      Pain Score 09/07/17 1503 8   Constitutional: Awake and alert. Diaphoretic.  Eyes: Conjunctivae are normal.  ENT   Head: Normocephalic and atraumatic.   Nose: No congestion/rhinnorhea.   Mouth/Throat: Mucous membranes are moist.   Neck: No stridor. Hematological/Lymphatic/Immunilogical: No cervical lymphadenopathy. Cardiovascular: Normal rate, regular rhythm.  No murmurs, rubs, or gallops.  Respiratory: Normal respiratory effort without tachypnea nor retractions. Breath sounds are clear and equal bilaterally. No wheezes/rales/rhonchi. Gastrointestinal: Soft and non tender. No rebound. No guarding.  Genitourinary: Deferred Musculoskeletal: Normal range of motion in all extremities. No lower extremity edema. Neurologic:  Normal speech and language. No gross focal neurologic deficits are appreciated.  Skin:  Skin is warm, dry and intact. No rash noted. Psychiatric: Mood and affect are normal. Speech and behavior are normal. Patient exhibits appropriate insight and  judgment.  ____________________________________________    LABS (pertinent positives/negatives)  CBC wbc 6.9, hgb 15.4 CMP k 3.3, glu 112, cr 1.29  ____________________________________________   EKG  I, Nance Pear, attending physician, personally viewed and interpreted this EKG  EKG Time: 1630 Rate: 50 Rhythm: sinus bradycardia Axis: normal Intervals: qtc 372 QRS: narrow, q waves V1 ST changes: no st elevation Impression: abnormal ekg   ____________________________________________    RADIOLOGY  CT head No intracranial bleed or mass. Possible sinusitis.   ____________________________________________   PROCEDURES  Procedures  ____________________________________________   INITIAL IMPRESSION / ASSESSMENT AND PLAN / ED COURSE  Pertinent labs & imaging results that were available during my care of the patient were reviewed by me and considered in my medical decision making (see chart for details).  Patient presented to the emergency department today because of concern for headache. ddx includes mass, bleed, infection, sinusitis, migraine, tension headache amongst other etiologies. It appears patient likely had a syncopal episode in waiting room. Think likely vasovagal given bradycardia and hypotension, however would consider other etiologies like anemia, electrolyte abnormality. Blood work unremarkable. Patient did feel better after a little while and fluids. Felt comfortable going home. Will treat for sinusitis. Discussed with patient plan.   ____________________________________________   FINAL CLINICAL IMPRESSION(S) / ED DIAGNOSES  Final diagnoses:  Sinus headache  Syncope, unspecified syncope type     Note: This dictation was prepared with Dragon dictation. Any transcriptional errors that result from this process are unintentional     Nance Pear, MD 09/07/17 2035

## 2017-09-07 NOTE — Telephone Encounter (Signed)
Given earache and sinus pain > 1 week, I would go ahead and treat with abx. F/u with me if not better in a few days  Doxycycline 100 mg, 1 po bid, #20

## 2017-09-07 NOTE — ED Notes (Signed)
Pt up to bathroom with assistance.  Md in with pt and family

## 2017-09-07 NOTE — Telephone Encounter (Addendum)
Pt  Has   Severe  Headache        Has   An  appt   At  0830  In  Am       meds  Not  Helping      Wondering  If  He  Needs  To  Go  To  Er    Tonight    Pain  Is   Constant  And  Is  Not  releived  By   Tylenol      Rena   At   [facility]  Speak  To  Provider     About     dispo

## 2017-09-07 NOTE — ED Notes (Signed)
Pt went to ct with 2 iv's, nsr on monitor at 5.  md and nurse at bedside with pt.  Color improved.  Pt talking   siderails up x 2.

## 2017-09-07 NOTE — ED Notes (Signed)
meds given for headache  nsr on monitor.  Pt wake and talking.

## 2017-09-07 NOTE — Telephone Encounter (Signed)
  Answer Assessment - Initial Assessment Questions 1. LO2. ONSET: "When did the headache start?" (Minutes, hours or days)      BACK  OF  HEAD   3. PATTERN: "Does the pain come and go, or has it been constant since it started?"     *No Answer* 4. SEVERITY: "How bad is the pain?" and "What does it keep you from doing?"  (e.g., Scale 1-10; mild, moderate, or severe)   - MILD (1-3): doesn't interfere with normal activities    - MODERATE (4-7): interferes with normal activities or awakens from sleep    - SEVERE (8-10): excruciating pain, unable to do any normal activities        *No Answer* 5. RECURRENT SYMPTOM: "Have you ever had headaches before?" If so, ask: "When was the last time?" and "What happened that time?"      *No Answer* 6. CAUSE: "What do you think is causing the headache?"     *No Answer* 7. MIGRAINE: "Have you been diagnosed with migraine headaches?" If so, ask: "Is this headache similar?"      *No Answer* 8. HEAD INJURY: "Has there been any recent injury to the head?"      *No Answer* 9. OTHER SYMPTOMS: "Do you have any other symptoms?" (fever, stiff neck, eye pain, sore throat, cold symptoms)     *No Answer* 10. PREGNANCY: "Is there any chance you are pregnant?" "When was your last menstrual period?"       *No Answer*  Protocols used: HEADACHE-A-AH

## 2017-09-07 NOTE — Telephone Encounter (Signed)
Mr. Ridener notified as instructed by telephone.  Doxycycline sent into [facility] as instructed by Dr. Lorelei Pont.

## 2017-09-07 NOTE — ED Notes (Signed)
Patient transported to CT 

## 2017-09-07 NOTE — ED Triage Notes (Signed)
Headache x 8 days. Denies head injury. Denies fever. Denies use of blood thinners. States started with sinus pressure.

## 2017-09-07 NOTE — ED Notes (Signed)
Pt was in flex waiting area and had a syncopal/seizure episode.  Pt brought to room 9 via wheelchair.  Pt was pale and diaphoretic.  Pt awakened and in sinus brady on monitor.  md at bedside.  Family with pt.  Pt states headache for 1 week and taking tylenol without pain relief.  No hx of seizures .

## 2017-09-07 NOTE — Telephone Encounter (Signed)
Nicole Kindred triage nurse from call center called re: continuous h/a. I spoke with Allie Bossier NP and she advised for pt to go to ED for eval and f/u appt with PCP. I spoke with pt and he is already at [facility] ED. Pt will schedule f/u appt with Dr Lorelei Pont after ED visit.

## 2017-09-07 NOTE — Telephone Encounter (Signed)
PLEASE NOTE: All timestamps contained within this report are represented as [place] Standard Time. CONFIDENTIALTY NOTICE: This fax transmission is intended only for the addressee. It contains information that is legally privileged, confidential or otherwise protected from use or disclosure. If you are not the intended recipient, you are strictly prohibited from reviewing, disclosing, copying using or disseminating any of this information or taking any action in reliance on or regarding this information. If you have received this fax in error, please notify us immediately by telephone so that we can arrange for its return to [place]. Phone: [phone], Toll-Free: [phone], Fax: [phone] Page: 1 of 2 Call Id: [id number] [name] Patient Name: Allen Long Gender: Male DOB: [birth year] Age: 31 Y 31 M 21 D Return Phone Number: [phone] (Primary) Address: City/State/Zip: [place] [address] Client [facility] - Client Client Site [place] Physician Alma Friendly - NP Contact Type Call Who Is Calling Patient / Member / Family / Caregiver Call Type Triage / Clinical Relationship To Patient Self Return Phone Number [phone] (Primary) Chief Complaint Nasal Congestion Reason for Call Symptomatic / Request for [place] said he was seen twice today for sinus headaches. Today has new symptoms. Translation No Nurse Assessment Nurse: [name], RN, [name] Date/Time (Eastern Time): 09/04/2017 5:35:30 PM Confirm and document reason for call. If symptomatic, describe symptoms. ---Caller states that he was seen on Tuesday for ear pain and a mild h/a. He was dx with allergy related s/s and advised to take otc meds. Then on Wed the ear pain went away but he began to develop a severe h/a. Pt was given an  injection for the h/a and advised to continue with the otc meds. The h/a is mild today but he has a little drainage in his eyes and post nasal drip. Denies fever. Does the patient have any new or worsening symptoms? ---Yes Will a triage be completed? ---Yes Related visit to physician within the last 2 weeks? ---Yes Does the PT have any chronic conditions? (i.e. diabetes, asthma, etc.) ---Yes List chronic conditions. ---hypothyroidism Is this a behavioral health or substance abuse call? ---No Guidelines Guideline Title Affirmed Question Affirmed Notes Nurse Date/Time (Eastern Time) Recent Medical Visit for Illness Follow-up Call [1] Recent medical visit within 24 hours AND [2] NEW symptom AND [3] that sounds mild [name] 09/04/2017 5:38:01 PM Disp. Time [name] Time) Disposition Final User 09/04/2017 5:45:23 PM Home Care Yes [name], RN, [name] PLEASE NOTE: All timestamps contained within this report are represented as [place] Standard Time. CONFIDENTIALTY NOTICE: This fax transmission is intended only for the addressee. It contains information that is legally privileged, confidential or otherwise protected from use or disclosure. If you are not the intended recipient, you are strictly prohibited from reviewing, disclosing, copying using or disseminating any of this information or taking any action in reliance on or regarding this information. If you have received this fax in error, please notify us immediately by telephone so that we can arrange for its return to [place]. Phone: [phone], Toll-Free: [phone], Fax: [phone] Page: 2 of 2 Call Id: [id number] [name] Disagree/Comply Comply Caller Understands Yes PreDisposition Did not know what to do Care Advice Given Per Guideline HOME CARE: You should be able to treat this at home. CARE ADVICE given per Recent Medical Visit for Illness: Follow-Up Call (Adult) guideline. * You become worse. CALL  BACK IF:

## 2017-09-07 NOTE — Telephone Encounter (Signed)
Pt's previous appt was seen last Thursday and his symptoms hasnt gotten better and is having an intense headache     Attempted to call patient-left message to call back.

## 2017-09-07 NOTE — Telephone Encounter (Signed)
Noted and agree to ED visit.

## 2017-09-07 NOTE — ED Notes (Signed)
This RN was called into flex waiting room.  Patient was lying on the floor with his sweatshirt under his head.  Patient was unresponsive when I got to him but quickly became alert after a few seconds.  Witnesses stated patient was walking to the bathroom and passed out.  Patient states he eased himself to the floor, witnesses disagree.  Patient very diaphoretic, charge nurse and first nurse notified.  This RN assisted in wheeling patient back to a room when patient became extremely pale, eyes rolled back and patient's body was jerking slightly.  MD immediately notified as patient was brought back to room.

## 2017-09-08 ENCOUNTER — Ambulatory Visit: Payer: BLUE CROSS/BLUE SHIELD | Admitting: Primary Care

## 2017-09-14 DIAGNOSIS — R51 Headache: Secondary | ICD-10-CM | POA: Diagnosis not present

## 2017-09-14 DIAGNOSIS — J0130 Acute sphenoidal sinusitis, unspecified: Secondary | ICD-10-CM | POA: Diagnosis not present

## 2017-09-14 DIAGNOSIS — J019 Acute sinusitis, unspecified: Secondary | ICD-10-CM | POA: Diagnosis not present

## 2017-09-22 DIAGNOSIS — J3489 Other specified disorders of nose and nasal sinuses: Secondary | ICD-10-CM | POA: Diagnosis not present

## 2017-09-22 DIAGNOSIS — J0130 Acute sphenoidal sinusitis, unspecified: Secondary | ICD-10-CM | POA: Diagnosis not present

## 2017-09-22 DIAGNOSIS — R51 Headache: Secondary | ICD-10-CM | POA: Diagnosis not present

## 2017-09-28 DIAGNOSIS — J329 Chronic sinusitis, unspecified: Secondary | ICD-10-CM | POA: Diagnosis not present

## 2017-10-06 DIAGNOSIS — D1801 Hemangioma of skin and subcutaneous tissue: Secondary | ICD-10-CM | POA: Diagnosis not present

## 2017-10-06 DIAGNOSIS — D229 Melanocytic nevi, unspecified: Secondary | ICD-10-CM | POA: Diagnosis not present

## 2017-10-06 DIAGNOSIS — D239 Other benign neoplasm of skin, unspecified: Secondary | ICD-10-CM

## 2017-10-06 DIAGNOSIS — D225 Melanocytic nevi of trunk: Secondary | ICD-10-CM | POA: Diagnosis not present

## 2017-10-06 DIAGNOSIS — D485 Neoplasm of uncertain behavior of skin: Secondary | ICD-10-CM | POA: Diagnosis not present

## 2017-10-06 DIAGNOSIS — D179 Benign lipomatous neoplasm, unspecified: Secondary | ICD-10-CM | POA: Diagnosis not present

## 2017-10-06 DIAGNOSIS — Z1283 Encounter for screening for malignant neoplasm of skin: Secondary | ICD-10-CM | POA: Diagnosis not present

## 2017-10-06 HISTORY — DX: Other benign neoplasm of skin, unspecified: D23.9

## 2017-10-12 DIAGNOSIS — J342 Deviated nasal septum: Secondary | ICD-10-CM | POA: Diagnosis not present

## 2017-10-12 DIAGNOSIS — J329 Chronic sinusitis, unspecified: Secondary | ICD-10-CM | POA: Diagnosis not present

## 2017-10-13 ENCOUNTER — Encounter: Payer: Self-pay | Admitting: *Deleted

## 2017-10-13 ENCOUNTER — Other Ambulatory Visit: Payer: Self-pay

## 2017-10-15 ENCOUNTER — Ambulatory Visit
Admission: RE | Admit: 2017-10-15 | Discharge: 2017-10-15 | Disposition: A | Discharge status: Home / Self Care | Payer: BLUE CROSS/BLUE SHIELD | Source: Ambulatory Visit | Attending: Otolaryngology | Admitting: Otolaryngology

## 2017-10-15 ENCOUNTER — Ambulatory Visit: Payer: BLUE CROSS/BLUE SHIELD | Admitting: Anesthesiology

## 2017-10-15 ENCOUNTER — Encounter
Admission: RE | Disposition: A | Discharge status: Home / Self Care | Payer: Self-pay | Source: Ambulatory Visit | Attending: Otolaryngology

## 2017-10-15 DIAGNOSIS — J342 Deviated nasal septum: Secondary | ICD-10-CM | POA: Diagnosis not present

## 2017-10-15 DIAGNOSIS — J343 Hypertrophy of nasal turbinates: Secondary | ICD-10-CM | POA: Insufficient documentation

## 2017-10-15 DIAGNOSIS — E039 Hypothyroidism, unspecified: Secondary | ICD-10-CM | POA: Insufficient documentation

## 2017-10-15 DIAGNOSIS — J322 Chronic ethmoidal sinusitis: Secondary | ICD-10-CM | POA: Insufficient documentation

## 2017-10-15 DIAGNOSIS — J338 Other polyp of sinus: Secondary | ICD-10-CM | POA: Insufficient documentation

## 2017-10-15 DIAGNOSIS — Z880 Allergy status to penicillin: Secondary | ICD-10-CM | POA: Diagnosis not present

## 2017-10-15 DIAGNOSIS — J0120 Acute ethmoidal sinusitis, unspecified: Secondary | ICD-10-CM | POA: Diagnosis not present

## 2017-10-15 DIAGNOSIS — J321 Chronic frontal sinusitis: Secondary | ICD-10-CM | POA: Insufficient documentation

## 2017-10-15 DIAGNOSIS — J329 Chronic sinusitis, unspecified: Secondary | ICD-10-CM | POA: Diagnosis not present

## 2017-10-15 DIAGNOSIS — J320 Chronic maxillary sinusitis: Secondary | ICD-10-CM | POA: Diagnosis not present

## 2017-10-15 DIAGNOSIS — J323 Chronic sphenoidal sinusitis: Secondary | ICD-10-CM | POA: Diagnosis not present

## 2017-10-15 HISTORY — PX: SPHENOIDECTOMY: SHX2421

## 2017-10-15 HISTORY — PX: NASAL SEPTOPLASTY W/ TURBINOPLASTY: SHX2070

## 2017-10-15 HISTORY — PX: SEPTOPLASTY WITH ETHMOIDECTOMY, AND MAXILLARY ANTROSTOMY: SHX6090

## 2017-10-15 HISTORY — PX: IMAGE GUIDED SINUS SURGERY: SHX6570

## 2017-10-15 HISTORY — PX: FRONTAL SINUS EXPLORATION: SHX6591

## 2017-10-15 SURGERY — SINUS SURGERY, WITH IMAGING GUIDANCE
Anesthesia: General | Site: Nose | Laterality: Bilateral | Wound class: Clean Contaminated

## 2017-10-15 MED ORDER — OXYMETAZOLINE HCL 0.05 % NA SOLN
1.0000 | Freq: Two times a day (BID) | NASAL | Status: DC
  Administered 2017-10-15: 1 via NASAL

## 2017-10-15 MED ORDER — ONDANSETRON HCL 4 MG/2ML IJ SOLN
INTRAMUSCULAR | Status: DC | PRN
  Administered 2017-10-15: 4 mg via INTRAVENOUS

## 2017-10-15 MED ORDER — ROCURONIUM BROMIDE 100 MG/10ML IV SOLN
INTRAVENOUS | Status: DC | PRN
  Administered 2017-10-15: 30 mg via INTRAVENOUS

## 2017-10-15 MED ORDER — FENTANYL CITRATE (PF) 100 MCG/2ML IJ SOLN
INTRAMUSCULAR | Status: DC | PRN
  Administered 2017-10-15: 100 ug via INTRAVENOUS
  Administered 2017-10-15 (×2): 25 ug via INTRAVENOUS

## 2017-10-15 MED ORDER — DEXAMETHASONE SODIUM PHOSPHATE 4 MG/ML IJ SOLN
INTRAMUSCULAR | Status: DC | PRN
  Administered 2017-10-15: 10 mg via INTRAVENOUS

## 2017-10-15 MED ORDER — GLYCOPYRROLATE 0.2 MG/ML IJ SOLN
INTRAMUSCULAR | Status: DC | PRN
  Administered 2017-10-15: 0.1 mg via INTRAVENOUS

## 2017-10-15 MED ORDER — ACETAMINOPHEN 10 MG/ML IV SOLN
1000.0000 mg | Freq: Once | INTRAVENOUS | Status: AC
  Administered 2017-10-15: 1000 mg via INTRAVENOUS

## 2017-10-15 MED ORDER — PROPOFOL 10 MG/ML IV BOLUS
INTRAVENOUS | Status: DC | PRN
  Administered 2017-10-15: 180 mg via INTRAVENOUS

## 2017-10-15 MED ORDER — OXYCODONE HCL 5 MG PO TABS
5.0000 mg | ORAL_TABLET | Freq: Once | ORAL | Status: AC | PRN
  Administered 2017-10-15: 5 mg via ORAL

## 2017-10-15 MED ORDER — OXYCODONE HCL 5 MG/5ML PO SOLN: 5.0000 mg | Freq: Once | ORAL | Status: AC | PRN

## 2017-10-15 MED ORDER — PHENYLEPHRINE HCL 0.5 % NA SOLN
NASAL | Status: DC | PRN
  Administered 2017-10-15: 15 mL via TOPICAL

## 2017-10-15 MED ORDER — MIDAZOLAM HCL 5 MG/5ML IJ SOLN
INTRAMUSCULAR | Status: DC | PRN
  Administered 2017-10-15: 2 mg via INTRAVENOUS

## 2017-10-15 MED ORDER — LACTATED RINGERS IV SOLN
10.0000 mL/h | INTRAVENOUS | Status: DC
  Administered 2017-10-15: 10 mL/h via INTRAVENOUS
  Administered 2017-10-15: 15:00:00 via INTRAVENOUS

## 2017-10-15 MED ORDER — DEXTROSE 5 % IV SOLN
2000.0000 mg | Freq: Once | INTRAVENOUS | Status: AC
  Administered 2017-10-15: 2000 mg via INTRAVENOUS

## 2017-10-15 MED ORDER — LIDOCAINE HCL (CARDIAC) 20 MG/ML IV SOLN
INTRAVENOUS | Status: DC | PRN
  Administered 2017-10-15: 50 mg via INTRAVENOUS

## 2017-10-15 MED ORDER — LIDOCAINE-EPINEPHRINE 1 %-1:100000 IJ SOLN
INTRAMUSCULAR | Status: DC | PRN
  Administered 2017-10-15: 6 mL

## 2017-10-15 MED ORDER — PROMETHAZINE HCL 25 MG/ML IJ SOLN: 6.2500 mg | INTRAMUSCULAR | Status: DC | PRN

## 2017-10-15 MED ORDER — MEPERIDINE HCL 25 MG/ML IJ SOLN: 6.2500 mg | INTRAMUSCULAR | Status: DC | PRN

## 2017-10-15 MED ORDER — FENTANYL CITRATE (PF) 100 MCG/2ML IJ SOLN
25.0000 ug | INTRAMUSCULAR | Status: DC | PRN
Start: 2017-10-15 — End: 2017-10-15

## 2017-10-15 SURGICAL SUPPLY — 36 items
BATTERY INSTRU NAVIGATION (MISCELLANEOUS) ×12 IMPLANT
CANISTER SUCT 1200ML W/VALVE (MISCELLANEOUS) ×3 IMPLANT
CATH IV 18X1 1/4 SAFELET (CATHETERS) ×3 IMPLANT
COAGULATOR SUCT 8FR VV (MISCELLANEOUS) ×3 IMPLANT
DRAPE HEAD BAR (DRAPES) ×3 IMPLANT
ELECT REM PT RETURN 9FT ADLT (ELECTROSURGICAL) ×3
ELECTRODE REM PT RTRN 9FT ADLT (ELECTROSURGICAL) ×2 IMPLANT
GLOVE PI ULTRA LF STRL 7.5 (GLOVE) ×4 IMPLANT
GLOVE PI ULTRA NON LATEX 7.5 (GLOVE) ×2
IV CATH 18X1 1/4 SAFELET (CATHETERS) ×2
IV NS 500ML (IV SOLUTION) ×1
IV NS 500ML BAXH (IV SOLUTION) ×2 IMPLANT
KIT ROOM TURNOVER OR (KITS) ×3 IMPLANT
NEEDLE ANESTHESIA  27G X 3.5 (NEEDLE) ×1
NEEDLE ANESTHESIA 27G X 3.5 (NEEDLE) ×2 IMPLANT
NEEDLE SPNL 25GX3.5 QUINCKE BL (NEEDLE) ×3 IMPLANT
NS IRRIG 500ML POUR BTL (IV SOLUTION) ×3 IMPLANT
PACK DRAPE NASAL/ENT (PACKS) ×3 IMPLANT
PACKING NASAL EPIS 4X2.4 XEROG (MISCELLANEOUS) ×9 IMPLANT
PATTIES SURGICAL .5 X3 (DISPOSABLE) ×3 IMPLANT
SHAVER DIEGO BLD STD TYPE A (BLADE) ×3 IMPLANT
SOL ANTI-FOG 6CC FOG-OUT (MISCELLANEOUS) ×2 IMPLANT
SOL FOG-OUT ANTI-FOG 6CC (MISCELLANEOUS) ×1
SPLINT NASAL SEPTAL BLV .50 ST (MISCELLANEOUS) ×3 IMPLANT
SPONGE XRAY 4X4 16PLY STRL (MISCELLANEOUS) ×3 IMPLANT
STRAP BODY AND KNEE 60X3 (MISCELLANEOUS) ×3 IMPLANT
SUT CHROMIC 3-0 (SUTURE) ×1
SUT CHROMIC 3-0 KS 27XMFL CR (SUTURE) ×2
SUT ETHILON 3-0 KS 30 BLK (SUTURE) ×3 IMPLANT
SUT PLAIN GUT 4-0 (SUTURE) ×3 IMPLANT
SUTURE CHRMC 3-0 KS 27XMFL CR (SUTURE) ×2 IMPLANT
SYR 3ML LL SCALE MARK (SYRINGE) ×3 IMPLANT
TOWEL OR 17X26 4PK STRL BLUE (TOWEL DISPOSABLE) ×3 IMPLANT
TRACKER CRANIALMASK (MASK) ×3 IMPLANT
TUBING DECLOG MULTIDEBRIDER (TUBING) ×3 IMPLANT
WATER STERILE IRR 250ML POUR (IV SOLUTION) ×3 IMPLANT

## 2017-10-15 NOTE — Op Note (Signed)
10/15/2017  3:49 PM  [id number]   Pre-Op Dx:  Deviated Nasal Septum, Hypertrophic Inferior Turbinates, bilateral maxillary sinusitis with opacity, bilateral ethmoid sinusitis, bilateral sphenoid sinusitis  Post-op Dx: Deviated nasal septum, hypertrophic inferior turbinates, bilateral maxillary sinusitis with opacity, bilateral ethmoid sinusitis, bilateral sphenoid sinusitis, bilateral frontal sinusitis  Proc: Bilateral endoscopic total ethmoidectomy, bilateral endoscopic sphenoid sinusotomy, bilateral endoscopic maxillary antrostomies with removal of contents, bilateral endoscopic frontal sinusotomy, Nasal Septoplasty, Bilateral Partial Reduction Inferior Turbinates, use of image guided system   Surg:  Bostyn Bogie H  Anes:  GOT  EBL:  100 mL  Comp:  None  Findings: Left maxillary sinus completely filled with polyps and a yellow-green paced at the bottom of the sinus. The right maxillary sinus just at polyps but no paste noted. Chronic mucosal membranes swelling involved ethmoids, sphenoid sinus and frontal sinus. Septum was deviated to the left side with enlarged inferior turbinates.  Procedure: With the patient in a comfortable supine position,  general orotracheal anesthesia was induced without difficulty.     The patient received preoperative Afrin spray for topical decongestion and vasoconstriction.  Intravenous prophylactic antibiotics were administered.  At an appropriate level, the patient was placed in a semi-sitting position.  Nasal vibrissae were trimmed.   1% Xylocaine with 1:100,000 epinephrine, 7 cc's, was infiltrated into the anterior floor of the nose, into the nasal spine region, into the membranous columella, and finally into the submucoperichondrial plane of the septum on both sides.  Several minutes were allowed for this to take effect.  Cottoniod pledgetts soaked in Afrin and 4% Xylocaine were placed into both nasal cavities and left while the patient was prepped and  draped in the standard fashion.  The image guided system was used. The CT scan was brought in and downloaded to the disc. The template was applied to the face and the template was registered to the system. There was 0.6 mm of variance. The suction instruments were registered then and checked for alignment. There appeared to be perfect alignment with both the straight and the angled frontal sinus suction.  The nose was inspected with a headlight and the 0 scope with the findings as described above.  A left Killian incision was sharply executed and carried down to the quadrangular cartilage. The mucoperichondrium was elelvated along the quadrangular plate back to the bony-cartilaginous junction. The mucoperiostium was then elevated along the ethmoid plate and the vomer. The boney-catilaginous junction was then split with a freer elevator and the mucoperiosteum was elevated on the opposite side. The mucoperiosteum was then elevated along the maxillary crest as needed to expose the crooked bone of the crest.  Boney spurs of the vomer and maxillary crest were removed with Donavan Foil forceps.  The cartilaginous plate was trimmed along its posterior and inferior borders of about 2 mm of cartilage to free it up inferiorly. Some of the deviated ethmoid plate was then fractured and removed with Takahashi forceps to free up the posterior border of the quadrangular plate and allow it to swing back to the midline. The mucosal flaps were placed back into their anatomic position to allow visualization of the airways. The septum now sat in the midline with an improved airway.  A 3-0 Chromic suture on a Keith needle in used to anchor the inferior septum at the nasal spine with a through and through suture. The mucosal flaps are then sutured together using a through and through whip stitch of 4-0 Plain Gut with a mini-Keith needle. This was used  to close the [place] incision as well.   The inferior turbinates were then  inspected. An incision was created along the inferior aspect of the left inferior turbinate with removal of some of the inferior soft tissue and bone. Electrocautery was used to control bleeding in the area. The remaining turbinate was then outfractured to open up the airway further. There was no significant bleeding noted. The right turbinate was then trimmed and outfractured in a similar fashion.  The 0 scope was then used to visualize left nasal passage. This is open all the way to the nasopharynx. The middle turbinate was medialized to visualize the middle meatus better. The uncinate process was incised and removed using the side biters and through biting 45 forceps. The Diego microdebrider was used to then trimmed up the edges. The natural ostium was found and widened and there were polyps completely filling the left maxillary sinus. There was a little bit of mucus around them. I used the 30 scope to now remove several these polyps from the maxillary sinus and as I got some of out I could see a yellow-green paste that was filling much of the bottom third of the maxillary sinus. I used suction and flushing to eventually break this all free and get it all out of the maxillary sinus. Once this was clear at the posterior ethmoid air cells were opened along with the middle ethmoid air cells. The Diego microdebrider was used to open these and the image guided system was used to make sure that all of the air cells were opened. The sphenoid sinus natural opening was found medial to the middle turbinate and this was widened. The party wall between the posterior ethmoid sphenoid sinus was then opened to create excellent drainage of the sphenoid sinus. Much of the mucosal swelling and mucus that a been there before was now mostly gone. The 30 scope was then used to open up the anterior ethmoid air cells. The mucosal swelling throughout sinuses. The anterior ethmoid air cells were opened and the opening to the  frontal sinus duct was seen but was quite small. The Boss frontal sinus instruments were used to widen the opening of the frontal sinus duct to create a clear visualized opening into the frontal sinus. These were through biting instruments to widen the sinus duct. The 70 scope was used for visualizing here and making sure that the sinus was clear. The image guided system was important for evaluating the openings and making sure that all the anterior ethmoid air cells were opened. Once this was cleared a cottonoid pledget was placed or temporarily soaked in phenylephrine for vasoconstriction.  The 0 scope was used on the right side and the airway was open and clear. The middle turbinate was medialized to get into the middle meatus. The uncinate process was incised and removed using the side biter and 45 through biting forceps. The maxillary antrum was widened and visualized the maxillary sinus using the 30 scope. There were polyps that were in the sinus that were removed. There is no purulence at the base the sinus as on the other side. The sinus completely opened through its natural ostium the 0 scope was then used for opening up the posterior middle ethmoid air cells. Used the image guided system to follow the extent of dissection make sure all air cells were opened. The Diego microdebrider was used for cleaning up the edges. The natural ostium of the sphenoid sinus was then found medial to the middle  turbinate and was widened using backbiting forceps and the Diego microdebrider. The party wall between the posterior ethmoid and sphenoid was then opened and widened to visualize the sinus wall and make sure it was completely clean. Some mucosal swelling but not as much as there had been previously. The 30 scope was then used for opening up the anterior ethmoid air cells. The frontal sinus duct was very small and was widened using the boss frontal instruments. The 70 scope was needed visualize these areas and  to make sure the frontal sinus duct was wide enough to remain open. It was mucosally lined as the other side was. The image guided system was used to make sure all the anterior ethmoid air cells were opened and the sinuses were all cleaned. The sinuses were then filled with cottonoid pledgets soaked in phenylephrine.  The left side was revisualized cottonoid pledgets removed. There is no significant bleeding. All the sinuses were visualized again with the 0 30 and 70 scopes. Xerogel was then placed into the anterior ethmoid air cells frontal sinus opening and then more it posterior ethmoid air cells. The sphenoid opening. Third piece was placed more into the middle ethmoid air cells helping to keep the middle turbinate medialized. The right side was then visualized with the 0 scope again and the cotton pledges removed. There is no significant bleeding. All the sinuses were visualized with the 0, 30, and 70 scopes. It was no further disease. Xerogel was placed into the anterior ethmoid at the frontal sinus duct and then again in the posterior ethmoid at the sphenoid sinus opening. More was placed at the middle ethmoid helping to medialize the turbinate. These were all liquefied on both sides.  The airways were then visualized and showed open passageways on both sides that were significantly improved compared to before surgery. There was no signifcant bleeding. Nasal splints were applied to both sides of the septum using Xomed 0.20mm regular sized splints that were trimmed, and then held in position with a 3-0 Nylon through and through suture.  The patient was turned back over to anesthesia, and awakened, extubated, and taken to the PACU in satisfactory condition.  Dispo:   PACU to home  Plan: Ice, elevation, narcotic analgesia, steroid taper, and prophylactic antibiotics for the duration of indwelling nasal foreign bodies.  We will reevaluate the patient in the office in 6 days and remove the septal  splints.  Return to work in 10 days, strenuous activities in two weeks.   Kamaryn Grimley H 10/15/2017 3:49 PM

## 2017-10-15 NOTE — Anesthesia Preprocedure Evaluation (Signed)
Anesthesia Evaluation  Patient identified by MRN, date of birth, ID band Patient awake    History of Anesthesia Complications Negative for: history of anesthetic complications  Airway Mallampati: I  TM Distance: >3 FB Neck ROM: Full    Dental no notable dental hx.    Pulmonary neg pulmonary ROS,    Pulmonary exam normal breath sounds clear to auscultation       Cardiovascular Exercise Tolerance: Good negative cardio ROS Normal cardiovascular exam Rhythm:Regular Rate:Normal     Neuro/Psych negative neurological ROS     GI/Hepatic negative GI ROS,   Endo/Other  Hypothyroidism   Renal/GU negative Renal ROS     Musculoskeletal   Abdominal   Peds  Hematology negative hematology ROS (+)   Anesthesia Other Findings   Reproductive/Obstetrics                             Anesthesia Physical Anesthesia Plan  ASA: II  Anesthesia Plan: General   Post-op Pain Management:    Induction: Intravenous  PONV Risk Score and Plan: 1 and Ondansetron  Airway Management Planned: Oral ETT  Additional Equipment:   Intra-op Plan:   Post-operative Plan: Extubation in OR  Informed Consent: I have reviewed the patients History and Physical, chart, labs and discussed the procedure including the risks, benefits and alternatives for the proposed anesthesia with the patient or authorized representative who has indicated his/her understanding and acceptance.     Plan Discussed with: CRNA  Anesthesia Plan Comments:         Anesthesia Quick Evaluation

## 2017-10-15 NOTE — Anesthesia Postprocedure Evaluation (Signed)
Anesthesia Post Note  Patient: NEKODA CHOCK  Procedure(s) Performed: IMAGE GUIDED SINUS SURGERY (Bilateral Nose) NASAL SEPTOPLASTY WITH TURBINATE REDUCTION (Bilateral Nose) SEPTOPLASTY WITH ETHMOIDECTOMY, AND MAXILLARY ANTROSTOMY (Bilateral Nose) SPHENOIDECTOMY (Bilateral Nose) FRONTAL SINUS EXPLORATION (Bilateral )  Patient location during evaluation: PACU Anesthesia Type: General Level of consciousness: awake and alert, oriented and patient cooperative Pain management: pain level controlled Vital Signs Assessment: post-procedure vital signs reviewed and stable Respiratory status: spontaneous breathing, nonlabored ventilation and respiratory function stable Cardiovascular status: blood pressure returned to baseline and stable Postop Assessment: adequate PO intake Anesthetic complications: no    Darrin Nipper

## 2017-10-15 NOTE — Anesthesia Procedure Notes (Signed)
Procedure Name: Intubation Date/Time: 10/15/2017 1:01 PM Performed by: Mayme Genta, CRNA Pre-anesthesia Checklist: Patient identified, Emergency Drugs available, Suction available, Patient being monitored and Timeout performed Patient Re-evaluated:Patient Re-evaluated prior to induction Oxygen Delivery Method: Circle system utilized Preoxygenation: Pre-oxygenation with 100% oxygen Induction Type: IV induction Ventilation: Mask ventilation without difficulty Laryngoscope Size: Miller and 3 Grade View: Grade I Tube type: Oral Rae Tube size: 7.5 mm Number of attempts: 1 Placement Confirmation: ETT inserted through vocal cords under direct vision,  positive ETCO2 and breath sounds checked- equal and bilateral Tube secured with: Tape Dental Injury: Teeth and Oropharynx as per pre-operative assessment

## 2017-10-15 NOTE — H&P (Signed)
H&P has been reviewedand patient reevaluated,  and no changes necessary. To be downloaded later.  

## 2017-10-15 NOTE — Discharge Instructions (Signed)
[facility] °[facility] °ENDOSCOPIC SINUS SURGERY °[facility] ° °What is Functional Endoscopic Sinus Surgery? ° The Surgery involves making the natural openings of the sinuses larger by removing the bony partitions that separate the sinuses from the nasal cavity.  The natural sinus lining is preserved as much as possible to allow the sinuses to resume normal function after the surgery.  In some patients nasal polyps (excessively swollen lining of the sinuses) may be removed to relieve obstruction of the sinus openings.  The surgery is performed through the nose using lighted scopes, which eliminates the need for incisions on the face.  A septoplasty is a different procedure which is sometimes performed with sinus surgery.  It involves straightening the boy partition that separates the two sides of your nose.  A crooked or deviated septum may need repair if is obstructing the sinuses or nasal airflow.  Turbinate reduction is also often performed during sinus surgery.  The turbinates are bony proturberances from the side walls of the nose which swell and can obstruct the nose in patients with sinus and allergy problems.  Their size can be surgically reduced to help relieve nasal obstruction. ° °What Can Sinus Surgery Do For Me? ° Sinus surgery can reduce the frequency of sinus infections requiring antibiotic treatment.  This can provide improvement in nasal congestion, post-nasal drainage, facial pressure and nasal obstruction.  Surgery will NOT prevent you from ever having an infection again, so it usually only for patients who get infections 4 or more times yearly requiring antibiotics, or for infections that do not clear with antibiotics.  It will not cure nasal allergies, so patients with allergies may still require medication to treat their allergies after surgery. Surgery may improve headaches related to sinusitis, however, some people will continue to  require medication to control sinus headaches related to allergies.  Surgery will do nothing for other forms of headache (migraine, tension or cluster). ° °What Are the Risks of Endoscopic Sinus Surgery? ° Current techniques allow surgery to be performed safely with little risk, however, there are rare complications that patients should be aware of.  Because the sinuses are located around the eyes, there is risk of eye injury, including blindness, though again, this would be quite rare. This is usually a result of bleeding behind the eye during surgery, which puts the vision oat risk, though there are treatments to protect the vision and prevent permanent disrupted by surgery causing a leak of the spinal fluid that surrounds the brain.  More serious complications would include bleeding inside the brain cavity or damage to the brain.  Again, all of these complications are uncommon, and spinal fluid leaks can be safely managed surgically if they occur.  The most common complication of sinus surgery is bleeding from the nose, which may require packing or cauterization of the nose.  Continued sinus have polyps may experience recurrence of the polyps requiring revision surgery.  Alterations of sense of smell or injury to the tear ducts are also rare complications.  ° °What is the Surgery Like, and what is the Recovery? ° The Surgery usually takes a couple of hours to perform, and is usually performed under a general anesthetic (completely asleep).  Patients are usually discharged home after a couple of hours.  Sometimes during surgery it is necessary to pack the nose to control bleeding, and the packing is left in place for 24 - 48 hours, and removed by your surgeon.    If a septoplasty was performed during the procedure, there is often a splint placed which must be removed after 5-7 days.   °Discomfort: Pain is usually mild to moderate, and can be controlled by prescription pain medication or acetaminophen (Tylenol).   Aspirin, Ibuprofen (Advil, Motrin), or Naprosyn (Aleve) should be avoided, as they can cause increased bleeding.  Most patients feel sinus pressure like they have a bad head cold for several days.  Sleeping with your head elevated can help reduce swelling and facial pressure, as can ice packs over the face.  A humidifier may be helpful to keep the mucous and blood from drying in the nose.  ° °Diet: There are no specific diet restrictions, however, you should generally start with clear liquids and a light diet of bland foods because the anesthetic can cause some nausea.  Advance your diet depending on how your stomach feels.  Taking your pain medication with food will often help reduce stomach upset which pain medications can cause. ° °Nasal Saline Irrigation: It is important to remove blood clots and dried mucous from the nose as it is healing.  This is done by having you irrigate the nose at least 3 - 4 times daily with a salt water solution.  We recommend using NeilMed Sinus Rinse (available at the drug store).  Fill the squeeze bottle with the solution, bend over a sink, and insert the tip of the squeeze bottle into the nose ½ of an inch.  Point the tip of the squeeze bottle towards the inside corner of the eye on the same side your irrigating.  Squeeze the bottle and gently irrigate the nose.  If you bend forward as you do this, most of the fluid will flow back out of the nose, instead of down your throat.   The solution should be warm, near body temperature, when you irrigate.   Each time you irrigate, you should use a full squeeze bottle.  ° °Note that if you are instructed to use Nasal Steroid Sprays at any time after your surgery, irrigate with saline BEFORE using the steroid spray, so you do not wash it all out of the nose. °Another product, Nasal Saline Gel (such as AYR Nasal Saline Gel) can be applied in each nostril 3 - 4 times daily to moisture the nose and reduce scabbing or crusting. ° °Bleeding:   Bloody drainage from the nose can be expected for several days, and patients are instructed to irrigate their nose frequently with salt water to help remove mucous and blood clots.  The drainage may be dark red or brown, though some fresh blood may be seen intermittently, especially after irrigation.  Do not blow you nose, as bleeding may occur. If you must sneeze, keep your mouth open to allow air to escape through your mouth. ° °If heavy bleeding occurs: Irrigate the nose with saline to rinse out clots, then spray the nose 3 - 4 times with Afrin Nasal Decongestant Spray.  The spray will constrict the blood vessels to slow bleeding.  Pinch the lower half of your nose shut to apply pressure, and lay down with your head elevated.  Ice packs over the nose may help as well. If bleeding persists despite these measures, you should notify your doctor.  Do not use the Afrin routinely to control nasal congestion after surgery, as it can result in worsening congestion and may affect healing.  ° ° ° °Activity: Return to work varies among patients. Most patients will be   out of work at least 5 - 7 days to recover.  Patient may return to work after they are off of narcotic pain medication, and feeling well enough to perform the functions of their job.  Patients must avoid heavy lifting (over 10 pounds) or strenuous physical for 2 weeks after surgery, so your employer may need to assign you to light duty, or keep you out of work longer if light duty is not possible.  NOTE: you should not drive, operate dangerous machinery, do any mentally demanding tasks or make any important legal or financial decisions while on narcotic pain medication and recovering from the general anesthetic.  °  °Call Your Doctor Immediately if You Have Any of the Following: °1. Bleeding that you cannot control with the above measures °2. Loss of vision, double vision, bulging of the eye or black eyes. °3. Fever over 101 degrees °4. Neck stiffness with  severe headache, fever, nausea and change in mental state. °You are always encourage to call anytime with concerns, however, please call with requests for pain medication refills during office hours. ° °Office Endoscopy: During follow-up visits your doctor will remove any packing or splints that may have been placed and evaluate and clean your sinuses endoscopically.  Topical anesthetic will be used to make this as comfortable as possible, though you may want to take your pain medication prior to the visit.  How often this will need to be done varies from patient to patient.  After complete recovery from the surgery, you may need follow-up endoscopy from time to time, particularly if there is concern of recurrent infection or nasal polyps. ° ° °General Anesthesia, Adult, Care After °These instructions provide you with information about caring for yourself after your procedure. Your health care provider may also give you more specific instructions. Your treatment has been planned according to current medical practices, but problems sometimes occur. Call your health care provider if you have any problems or questions after your procedure. °What can I expect after the procedure? °After the procedure, it is common to have: °· Vomiting. °· A sore throat. °· Mental slowness. ° °It is common to feel: °· Nauseous. °· Cold or shivery. °· Sleepy. °· Tired. °· Sore or achy, even in parts of your body where you did not have surgery. ° °Follow these instructions at home: °For at least 24 hours after the procedure: °· Do not: °? Participate in activities where you could fall or become injured. °? Drive. °? Use heavy machinery. °? Drink alcohol. °? Take sleeping pills or medicines that cause drowsiness. °? Make important decisions or sign legal documents. °? Take care of children on your own. °· Rest. °Eating and drinking °· If you vomit, drink water, juice, or soup when you can drink without vomiting. °· Drink enough fluid to  keep your urine clear or pale yellow. °· Make sure you have little or no nausea before eating solid foods. °· Follow the diet recommended by your health care provider. °General instructions °· Have a responsible adult stay with you until you are awake and alert. °· Return to your normal activities as told by your health care provider. Ask your health care provider what activities are safe for you. °· Take over-the-counter and prescription medicines only as told by your health care provider. °· If you smoke, do not smoke without supervision. °· Keep all follow-up visits as told by your health care provider. This is important. °Contact a health care provider if: °· You   continue to have nausea or vomiting at home, and medicines are not helpful. °· You cannot drink fluids or start eating again. °· You cannot urinate after 8-12 hours. °· You develop a skin rash. °· You have fever. °· You have increasing redness at the site of your procedure. °Get help right away if: °· You have difficulty breathing. °· You have chest pain. °· You have unexpected bleeding. °· You feel that you are having a life-threatening or urgent problem. °This information is not intended to replace advice given to you by your health care provider. Make sure you discuss any questions you have with your health care provider. °Document Released: 02/09/2001 Document Revised: 04/07/2016 Document Reviewed: 10/18/2015 °Elsevier Interactive Patient Education © 2018 [employer] Inc[employer] ° °

## 2017-10-16 ENCOUNTER — Encounter: Payer: Self-pay | Admitting: Otolaryngology

## 2017-10-16 NOTE — Transfer of Care (Signed)
Immediate Anesthesia Transfer of Care Note  Patient: Allen Long  Procedure(s) Performed: IMAGE GUIDED SINUS SURGERY (Bilateral Nose) NASAL SEPTOPLASTY WITH TURBINATE REDUCTION (Bilateral Nose) SEPTOPLASTY WITH ETHMOIDECTOMY, AND MAXILLARY ANTROSTOMY (Bilateral Nose) SPHENOIDECTOMY (Bilateral Nose) FRONTAL SINUS EXPLORATION (Bilateral )  Patient Location: PACU  Anesthesia Type: General  Level of Consciousness: awake, alert  and patient cooperative  Airway and Oxygen Therapy: Patient Spontanous Breathing and Patient connected to supplemental oxygen  Post-op Assessment: Post-op Vital signs reviewed, Patient's Cardiovascular Status Stable, Respiratory Function Stable, Patent Airway and No signs of Nausea or vomiting  Post-op Vital Signs: Reviewed and stable  Complications: No apparent anesthesia complications

## 2017-10-21 DIAGNOSIS — Z48813 Encounter for surgical aftercare following surgery on the respiratory system: Secondary | ICD-10-CM | POA: Diagnosis not present

## 2017-10-29 LAB — SURGICAL PATHOLOGY

## 2017-10-30 DIAGNOSIS — Z48813 Encounter for surgical aftercare following surgery on the respiratory system: Secondary | ICD-10-CM | POA: Diagnosis not present

## 2017-11-06 DIAGNOSIS — Z48813 Encounter for surgical aftercare following surgery on the respiratory system: Secondary | ICD-10-CM | POA: Diagnosis not present

## 2017-11-18 DIAGNOSIS — Z48813 Encounter for surgical aftercare following surgery on the respiratory system: Secondary | ICD-10-CM | POA: Diagnosis not present

## 2017-12-02 DIAGNOSIS — Z48813 Encounter for surgical aftercare following surgery on the respiratory system: Secondary | ICD-10-CM | POA: Diagnosis not present

## 2017-12-11 DIAGNOSIS — J301 Allergic rhinitis due to pollen: Secondary | ICD-10-CM | POA: Diagnosis not present

## 2017-12-14 DIAGNOSIS — J329 Chronic sinusitis, unspecified: Secondary | ICD-10-CM | POA: Diagnosis not present

## 2017-12-16 DIAGNOSIS — J329 Chronic sinusitis, unspecified: Secondary | ICD-10-CM | POA: Diagnosis not present

## 2017-12-17 DIAGNOSIS — J301 Allergic rhinitis due to pollen: Secondary | ICD-10-CM | POA: Diagnosis not present

## 2017-12-21 DIAGNOSIS — J301 Allergic rhinitis due to pollen: Secondary | ICD-10-CM | POA: Diagnosis not present

## 2017-12-23 DIAGNOSIS — Z48813 Encounter for surgical aftercare following surgery on the respiratory system: Secondary | ICD-10-CM | POA: Diagnosis not present

## 2017-12-24 DIAGNOSIS — J301 Allergic rhinitis due to pollen: Secondary | ICD-10-CM | POA: Diagnosis not present

## 2017-12-28 DIAGNOSIS — J301 Allergic rhinitis due to pollen: Secondary | ICD-10-CM | POA: Diagnosis not present

## 2017-12-31 DIAGNOSIS — J301 Allergic rhinitis due to pollen: Secondary | ICD-10-CM | POA: Diagnosis not present

## 2018-01-01 DIAGNOSIS — J301 Allergic rhinitis due to pollen: Secondary | ICD-10-CM | POA: Diagnosis not present

## 2018-01-04 DIAGNOSIS — J301 Allergic rhinitis due to pollen: Secondary | ICD-10-CM | POA: Diagnosis not present

## 2018-01-07 DIAGNOSIS — J301 Allergic rhinitis due to pollen: Secondary | ICD-10-CM | POA: Diagnosis not present

## 2018-01-08 DIAGNOSIS — Z48813 Encounter for surgical aftercare following surgery on the respiratory system: Secondary | ICD-10-CM | POA: Diagnosis not present

## 2018-01-11 DIAGNOSIS — J301 Allergic rhinitis due to pollen: Secondary | ICD-10-CM | POA: Diagnosis not present

## 2018-01-14 DIAGNOSIS — J301 Allergic rhinitis due to pollen: Secondary | ICD-10-CM | POA: Diagnosis not present

## 2018-01-18 DIAGNOSIS — J301 Allergic rhinitis due to pollen: Secondary | ICD-10-CM | POA: Diagnosis not present

## 2018-01-21 DIAGNOSIS — J301 Allergic rhinitis due to pollen: Secondary | ICD-10-CM | POA: Diagnosis not present

## 2018-01-25 DIAGNOSIS — J301 Allergic rhinitis due to pollen: Secondary | ICD-10-CM | POA: Diagnosis not present

## 2018-01-28 DIAGNOSIS — J301 Allergic rhinitis due to pollen: Secondary | ICD-10-CM | POA: Diagnosis not present

## 2018-02-01 DIAGNOSIS — J301 Allergic rhinitis due to pollen: Secondary | ICD-10-CM | POA: Diagnosis not present

## 2018-02-04 DIAGNOSIS — J301 Allergic rhinitis due to pollen: Secondary | ICD-10-CM | POA: Diagnosis not present

## 2018-02-08 DIAGNOSIS — J301 Allergic rhinitis due to pollen: Secondary | ICD-10-CM | POA: Diagnosis not present

## 2018-02-11 DIAGNOSIS — J301 Allergic rhinitis due to pollen: Secondary | ICD-10-CM | POA: Diagnosis not present

## 2018-02-15 DIAGNOSIS — J301 Allergic rhinitis due to pollen: Secondary | ICD-10-CM | POA: Diagnosis not present

## 2018-02-18 DIAGNOSIS — J301 Allergic rhinitis due to pollen: Secondary | ICD-10-CM | POA: Diagnosis not present

## 2018-02-22 DIAGNOSIS — J301 Allergic rhinitis due to pollen: Secondary | ICD-10-CM | POA: Diagnosis not present

## 2018-02-25 DIAGNOSIS — J301 Allergic rhinitis due to pollen: Secondary | ICD-10-CM | POA: Diagnosis not present

## 2018-03-01 DIAGNOSIS — J301 Allergic rhinitis due to pollen: Secondary | ICD-10-CM | POA: Diagnosis not present

## 2018-03-04 DIAGNOSIS — J301 Allergic rhinitis due to pollen: Secondary | ICD-10-CM | POA: Diagnosis not present

## 2018-03-09 DIAGNOSIS — J301 Allergic rhinitis due to pollen: Secondary | ICD-10-CM | POA: Diagnosis not present

## 2018-03-10 ENCOUNTER — Ambulatory Visit: Payer: BLUE CROSS/BLUE SHIELD | Admitting: Family Medicine

## 2018-03-10 ENCOUNTER — Encounter: Payer: Self-pay | Admitting: Family Medicine

## 2018-03-10 ENCOUNTER — Other Ambulatory Visit: Payer: Self-pay

## 2018-03-10 VITALS — BP 140/90 | HR 84 | Temp 98.6°F | Ht 73.0 in | Wt 187.5 lb

## 2018-03-10 DIAGNOSIS — N4340 Spermatocele of epididymis, unspecified: Secondary | ICD-10-CM

## 2018-03-10 DIAGNOSIS — K648 Other hemorrhoids: Secondary | ICD-10-CM

## 2018-03-10 DIAGNOSIS — Z3009 Encounter for other general counseling and advice on contraception: Secondary | ICD-10-CM

## 2018-03-10 MED ORDER — HYDROCORTISONE ACETATE 25 MG RE SUPP: 25.0000 mg | Freq: Two times a day (BID) | RECTAL | 1 refills | Status: DC

## 2018-03-10 NOTE — Patient Instructions (Signed)
Dr. Bernardo Heater, [facility]

## 2018-03-10 NOTE — Progress Notes (Signed)
Dr. Frederico Hamman T. Estefania Kamiya, MD, [facility] [place], [address] Phone: [phone] Fax: [phone]  03/10/2018  Patient: Allen Long, MRN: [id number], DOB: 1986/01/22, 32 y.o.  Primary Physician:  Owens Loffler, MD   Chief Complaint  Patient presents with  . Hemorrhoids  . Cyst on Testicle    Right   Subjective:   Allen Long is a 32 y.o. very pleasant male patient who presents with the following:  Has tried some prep h.  8 years ago, felt a lump on his R testicle and and had a spermatocoele.  hemorrhoids  Very nice guy is here with a few different issues.  Thinks he might have some hemorrhoids, occasionally does feel something around in the rectum region, and this is primarily felt when he is sitting.  They have been bothering him somewhat recently, and he has not had any kind of bleeding at all.  He also has a cyst on his testicle, and wanted to make sure that this felt okay.  He has had this evaluated in the past about 8 years ago, and at that point they thought that he was having a spermatocele.  He also is interested in getting a vasectomy.  Wanted my thoughts on this and recommendations regarding individuals who might be able to do this for him.  Past Medical History, Surgical History, Social History, Family History, Problem List, Medications, and Allergies have been reviewed and updated if relevant.  Patient Active Problem List   Diagnosis Date Noted  . Acute pharyngitis 07/15/2017  . Hypothyroid     Past Medical History:  Diagnosis Date  . Hypothyroid     Past Surgical History:  Procedure Laterality Date  . APPENDECTOMY    . FRONTAL SINUS EXPLORATION Bilateral 10/15/2017   Procedure: FRONTAL SINUS EXPLORATION;  Surgeon: Margaretha Sheffield, MD;  Location: [facility];  Service: ENT;  Laterality: Bilateral;  . HYPOSPADIAS CORRECTION     as infant  . IMAGE GUIDED SINUS SURGERY Bilateral  10/15/2017   Procedure: IMAGE GUIDED SINUS SURGERY;  Surgeon: Margaretha Sheffield, MD;  Location: [facility];  Service: ENT;  Laterality: Bilateral;  . MASS EXCISION     scar tissue from prior surgery  . NASAL SEPTOPLASTY W/ TURBINOPLASTY Bilateral 10/15/2017   Procedure: NASAL SEPTOPLASTY WITH TURBINATE REDUCTION;  Surgeon: Margaretha Sheffield, MD;  Location: [facility];  Service: ENT;  Laterality: Bilateral;  . SEPTOPLASTY WITH ETHMOIDECTOMY, AND MAXILLARY ANTROSTOMY Bilateral 10/15/2017   Procedure: SEPTOPLASTY WITH ETHMOIDECTOMY, AND MAXILLARY ANTROSTOMY;  Surgeon: Margaretha Sheffield, MD;  Location: [facility];  Service: ENT;  Laterality: Bilateral;  . SPHENOIDECTOMY Bilateral 10/15/2017   Procedure: SPHENOIDECTOMY;  Surgeon: Margaretha Sheffield, MD;  Location: [facility];  Service: ENT;  Laterality: Bilateral;    Social History   Socioeconomic History  . Marital status: Married    Spouse name: [name]  . Number of children: Not on file  . Years of education: Not on file  . Highest education level: Not on file  Occupational History  . Occupation: Optometrist    Comment: [employer]  Social Needs  . Financial resource strain: Not on file  . Food insecurity:    Worry: Not on file    Inability: Not on file  . Transportation needs:    Medical: Not on file    Non-medical: Not on file  Tobacco Use  . Smoking status: Never Smoker  . Smokeless tobacco: Never Used  Substance and Sexual Activity  . Alcohol use: Yes    Alcohol/week: 1.8 oz    Types: 1 Glasses of wine, 2 Cans of beer per week  . Drug use: No  . Sexual activity: Yes    Partners: Female  Lifestyle  . Physical activity:    Days per week: Not on file    Minutes per session: Not on file  . Stress: Not on file  Relationships  . Social connections:    Talks on phone: Not on file    Gets together: Not on file    Attends religious service: Not on file    Active member of club or organization: Not on file      Attends meetings of clubs or organizations: Not on file    Relationship status: Not on file  . Intimate partner violence:    Fear of current or ex partner: Not on file    Emotionally abused: Not on file    Physically abused: Not on file    Forced sexual activity: Not on file  Other Topics Concern  . 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       [name] is wife.    From [place].   [employer] undergrad - wife works there.     History reviewed. No pertinent family history.  Allergies  Allergen Reactions  . Penicillins     REACTION: Rash as a child (has had Augmentin and Keflex with NO reaction)    Medication list reviewed and updated in full in [place].   GEN: No acute illnesses, no fevers, chills. GI: No n/v/d, eating normally Pulm: No SOB Interactive and getting along well at home.  Otherwise, ROS is as per the HPI.  Objective:   BP 140/90   Pulse 84   Temp 98.6 F (37 C) (Oral)   Ht 6\' 1"  (1.854 m)   Wt 187 lb 8 oz (85 kg)   BMI 24.74 kg/m   GEN: WDWN, NAD, Non-toxic, A & O x 3 HEENT: Atraumatic, Normocephalic. Neck supple. No masses, No LAD. Ears and Nose: No external deformity. CV: RRR, No M/G/R. No JVD. No thrill. No extra heart sounds. PULM: CTA B, no wheezes, crackles, rhonchi. No retractions. No resp. distress. No accessory muscle use. EXTR: No c/c/e NEURO Normal gait.  PSYCH: Normally interactive. Conversant. Not depressed or anxious appearing.  Calm demeanor.   GU: Normal circumcised male.  On the right patient does on the posterior aspect of his testicle have what appears to be a cyst  On rectal examination there is no appreciable external hemorrhoid, but internally on rectal exam there does appear to be 2 inferior relatively small hemorrhoids.  Laboratory and Imaging Data:  Assessment and Plan:   Spermatocele  Internal hemorrhoid  Encounter for vasectomy counseling  I reassured him about what I also think  is likely a spermatocele.  Neck is probably reasonable to just have a urologist check it when he is going in for his vasectomy evaluation.  I gave him the name of local urologist   As recommendation.  Small internal hemorrhoids, I gave him some suppositories to use this week to see if that will calm it down a little bit.  Follow-up: No follow-ups on file.  Meds ordered this encounter  Medications  . hydrocortisone (ANUSOL-HC) 25 MG suppository    Sig: Place 1 suppository (25 mg total) rectally 2 (two) times daily.    Dispense:  12 suppository  Refill:  1   Signed,  Cleone Hulick T. Jadin Creque, MD   Allergies as of 03/10/2018      Reactions   Penicillins    REACTION: Rash as a child (has had Augmentin and Keflex with NO reaction)      Medication List        Accurate as of 03/10/18 11:59 PM. Always use your most recent med list.          FISH OIL PO Take by mouth daily.   hydrocortisone 25 MG suppository Commonly known as:  ANUSOL-HC Place 1 suppository (25 mg total) rectally 2 (two) times daily.   levocetirizine 5 MG tablet Commonly known as:  XYZAL Take 5 mg by mouth every evening.   levothyroxine 175 MCG tablet Commonly known as:  SYNTHROID, LEVOTHROID Take 175 mcg by mouth daily before breakfast. Mon - Fri   levothyroxine 200 MCG tablet Commonly known as:  SYNTHROID, LEVOTHROID Take 200 mcg by mouth daily before breakfast. Sat, Sun   multivitamin tablet Take 1 tablet by mouth daily.   XHANCE 93 MCG/ACT Exhu Generic drug:  Fluticasone Propionate Place 1 spray into both nostrils 2 (two) times daily.

## 2018-03-11 DIAGNOSIS — J301 Allergic rhinitis due to pollen: Secondary | ICD-10-CM | POA: Diagnosis not present

## 2018-03-15 DIAGNOSIS — J301 Allergic rhinitis due to pollen: Secondary | ICD-10-CM | POA: Diagnosis not present

## 2018-03-18 DIAGNOSIS — J301 Allergic rhinitis due to pollen: Secondary | ICD-10-CM | POA: Diagnosis not present

## 2018-03-22 DIAGNOSIS — J301 Allergic rhinitis due to pollen: Secondary | ICD-10-CM | POA: Diagnosis not present

## 2018-03-26 ENCOUNTER — Telehealth: Payer: Self-pay | Admitting: Family Medicine

## 2018-03-26 ENCOUNTER — Other Ambulatory Visit: Payer: Self-pay | Admitting: Family Medicine

## 2018-03-26 DIAGNOSIS — K649 Unspecified hemorrhoids: Secondary | ICD-10-CM

## 2018-03-26 NOTE — Telephone Encounter (Signed)
done

## 2018-03-26 NOTE — Telephone Encounter (Signed)
Copied from [place] [id number]. Topic: Quick Communication - See Telephone Encounter >> Mar 26, 2018  8:44 AM Boyd Kerbs wrote: CRM for notification. See Telephone encounter for: 03/26/18.  Pt. Is asking for referral to GI doctor ([facility] - prefers Male physician)  Needing to make appt asap

## 2018-03-26 NOTE — Progress Notes (Signed)
done

## 2018-03-26 NOTE — Telephone Encounter (Signed)
I spoke with pt; pt saw Dr Lorelei Pont 03/10/18; pts hemorrhoid is no better; no bleeding and pt wants to see specialist in [place] at [facility]; pt wants male physician.

## 2018-03-29 DIAGNOSIS — J301 Allergic rhinitis due to pollen: Secondary | ICD-10-CM | POA: Diagnosis not present

## 2018-03-31 ENCOUNTER — Encounter: Payer: Self-pay | Admitting: Gastroenterology

## 2018-03-31 ENCOUNTER — Ambulatory Visit: Payer: BLUE CROSS/BLUE SHIELD | Admitting: Gastroenterology

## 2018-03-31 VITALS — BP 130/83 | HR 83 | Resp 18 | Ht 73.0 in | Wt 189.0 lb

## 2018-03-31 DIAGNOSIS — K640 First degree hemorrhoids: Secondary | ICD-10-CM

## 2018-03-31 DIAGNOSIS — F411 Generalized anxiety disorder: Secondary | ICD-10-CM

## 2018-03-31 DIAGNOSIS — K644 Residual hemorrhoidal skin tags: Secondary | ICD-10-CM

## 2018-03-31 NOTE — Progress Notes (Signed)
Allen Darby, MD [address]  [place]  [place], [place] [address]  Main: [phone]  Fax: [phone]    Gastroenterology Consultation  Referring Provider:     Owens Loffler, MD Primary Care Physician:  Owens Loffler, MD Primary Gastroenterologist:  Dr. Cephas Long Reason for Consultation:     Rectal pain, symptomatic hemorrhoids        HPI:   Allen Long is a 32 y.o. male referred by Dr. Owens Loffler, MD  for consultation & management of rectal discomfort. He has history of hypothyroidism, otherwise healthy who presents with intermittent episodes of rectal discomfort. 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 He reports that for the last 1 month has been having severe rectal discomfort and is unable to sit. He has been using doughnut. He used Anusol suppository for about one and half weeks which she did not notice any difference. He is referred here for further evaluation. He denies rectal bleeding, sharp tearing pain associated with bowel movement, denies constipation or diarrhea, he does not spend a lot of time on toilet, denies sensation of incomplete emptying. He denies weight loss or loss of appetite.  Today has been a good day for him.   NSAIDs: none  Antiplts/Anticoagulants/Anti thrombotics: none  GI Procedures: none He denies family history of GI malignancy  Past Medical History:  Diagnosis Date  . Hypothyroid     Past Surgical History:  Procedure Laterality Date  . APPENDECTOMY    . FRONTAL SINUS EXPLORATION Bilateral 10/15/2017   Procedure: FRONTAL SINUS EXPLORATION;  Surgeon: Margaretha Sheffield, MD;  Location: [facility];  Service: ENT;  Laterality: Bilateral;  . HYPOSPADIAS CORRECTION     as infant  . IMAGE GUIDED SINUS SURGERY Bilateral 10/15/2017   Procedure: IMAGE GUIDED SINUS SURGERY;  Surgeon: Margaretha Sheffield, MD;  Location: [facility];  Service: ENT;  Laterality: Bilateral;  . MASS  EXCISION     scar tissue from prior surgery  . NASAL SEPTOPLASTY W/ TURBINOPLASTY Bilateral 10/15/2017   Procedure: NASAL SEPTOPLASTY WITH TURBINATE REDUCTION;  Surgeon: Margaretha Sheffield, MD;  Location: [facility];  Service: ENT;  Laterality: Bilateral;  . SEPTOPLASTY WITH ETHMOIDECTOMY, AND MAXILLARY ANTROSTOMY Bilateral 10/15/2017   Procedure: SEPTOPLASTY WITH ETHMOIDECTOMY, AND MAXILLARY ANTROSTOMY;  Surgeon: Margaretha Sheffield, MD;  Location: [facility];  Service: ENT;  Laterality: Bilateral;  . SPHENOIDECTOMY Bilateral 10/15/2017   Procedure: SPHENOIDECTOMY;  Surgeon: Margaretha Sheffield, MD;  Location: [facility];  Service: ENT;  Laterality: Bilateral;    Current Outpatient Medications:  .  levocetirizine (XYZAL) 5 MG tablet, Take 5 mg by mouth every evening., Disp: , Rfl:  .  levothyroxine (SYNTHROID, LEVOTHROID) 175 MCG tablet, Take 175 mcg by mouth daily before breakfast. Mon - Fri, Disp: , Rfl:  .  levothyroxine (SYNTHROID, LEVOTHROID) 200 MCG tablet, Take 200 mcg by mouth daily before breakfast. Sat, Sun, Disp: , Rfl:  .  Multiple Vitamin (MULTIVITAMIN) tablet, Take 1 tablet by mouth daily., Disp: , Rfl:  .  Omega-3 Fatty Acids (FISH OIL PO), Take by mouth daily., Disp: , Rfl:  .  XHANCE 93 MCG/ACT EXHU, Place 1 spray into both nostrils 2 (two) times daily., Disp: , Rfl: 11 .  hydrocortisone (ANUSOL-HC) 25 MG suppository, Place 1 suppository (25 mg total) rectally 2 (two) times daily. (Patient not taking: Reported on 03/31/2018), Disp: 12 suppository, Rfl: 1  No family history on file.   Social History  Tobacco Use  . Smoking status: Never Smoker  . Smokeless tobacco: Never Used  Substance Use Topics  . Alcohol use: Yes    Alcohol/week: 1.8 oz    Types: 1 Glasses of wine, 2 Cans of beer per week  . Drug use: No    Allergies as of 03/31/2018 - Review Complete 03/31/2018  Allergen Reaction Noted  . Penicillins      Review of Systems:    All systems  reviewed and negative except where noted in HPI.   Physical Exam:  BP 130/83 (BP Location: Left Arm, Patient Position: Sitting, Cuff Size: Large)   Pulse 83   Resp 18   Ht 6\' 1"  (1.854 m)   Wt 189 lb (85.7 kg)   BMI 24.94 kg/m  No LMP for male patient.  General:   Alert,  Well-developed, well-nourished, pleasant and cooperative in NAD Head:  Normocephalic and atraumatic. Eyes:  Sclera clear, no icterus.   Conjunctiva pink. Ears:  Normal auditory acuity. Nose:  No deformity, discharge, or lesions. Mouth:  No deformity or lesions,oropharynx pink & moist. Neck:  Supple; no masses or thyromegaly. Lungs:  Respirations even and unlabored.  Clear throughout to auscultation.   No wheezes, crackles, or rhonchi. No acute distress. Heart:  Regular rate and rhythm; no murmurs, clicks, rubs, or gallops. Abdomen:  Normal bowel sounds. Soft, non-tender and non-distended without masses, hepatosplenomegaly or hernias noted.  No guarding or rebound tenderness.   Rectal: Distal rectal exam was normal Msk:  Symmetrical without gross deformities. Good, equal movement & strength bilaterally. Pulses:  Normal pulses noted. Extremities:  No clubbing or edema.  No cyanosis. Neurologic:  Alert and oriented x3;  grossly normal neurologically. Skin:  Intact without significant lesions or rashes. No jaundice. Lymph Nodes:  No significant cervical adenopathy. Psych:  Alert and cooperative. Normal mood and affect.  Imaging Studies: No abdominal imaging  Assessment and Plan:   Allen Long is a 32 y.o. Caucasian male with history of hypothyroidism presents with symptomatic external hemorrhoids. His symptoms are refractory to medical management. Rectal exam did not reveal anal fissure. I discussed with him about outpatient hemorrhoid ligation and he is interested to proceed with it today. He is going on a cruise to [place] tomorrow. I gave him an option to wait until he returns from vacation. Patient decided to  undergo procedure today. Consent obtained  Right posterior internal hemorrhoid is ligated Patient felt fine during the procedure, however when he was about check out, he passed out, was pale, bradycardic and hypotensive. His vitals returned to normal after giving him some liquids to drink and after lying down. He denied rectal pain, pinching, lower abdominal cramps or throbbing sensation. He told me that he is always anxious seeing doctors around and he had prior episodes of vasovagal attacks, in the ER and prior to sinus surgery, when he was undergoing IV placement.   Follow up in 2 weeks for hemorrhoidal ligation   Allen Darby, MD

## 2018-03-31 NOTE — Progress Notes (Signed)
PROCEDURE NOTE: The patient presents with symptomatic grade 1 hemorrhoids, unresponsive to maximal medical therapy, requesting rubber band ligation of his/her hemorrhoidal disease.  All risks, benefits and alternative forms of therapy were described and informed consent was obtained.  In the Left Lateral Decubitus position (if anoscopy is performed) anoscopic examination revealed grade 1 hemorrhoids in the all position(s).   The decision was made to band the RP internal hemorrhoid, and the [place] was used to perform band ligation without complication.  Digital anorectal examination was then performed to assure proper positioning of the band, and to adjust the banded tissue as required.  The patient was discharged home without pain or other issues.  Dietary and behavioral recommendations were given and (if necessary - prescriptions were given), along with follow-up instructions.  The patient will return 2 weeks for follow-up and possible additional banding as required.  Patient felt fine during the procedure, however when he was about check out, he passed out in the hall way, was pale, bradycardic and hypotensive. His vitals returned to normal after giving him some liquids to drink and after lying down. He denied rectal pain, pinching, lower abdominal cramps or throbbing sensation. He told me that he is always anxious seeing doctors around and he had prior episodes of vasovagal attacks, in the ER and prior to sinus surgery, when he was undergoing IV placement.  Cephas Darby, MD [address]  [place]  [place], [place] [address]  Main: [phone]  Fax: [phone] Pager: [phone]

## 2018-04-05 DIAGNOSIS — J301 Allergic rhinitis due to pollen: Secondary | ICD-10-CM | POA: Diagnosis not present

## 2018-04-09 ENCOUNTER — Telehealth: Payer: Self-pay

## 2018-04-09 ENCOUNTER — Inpatient Hospital Stay
Admission: EM | Admit: 2018-04-09 | Discharge: 2018-04-11 | DRG: 395 | Disposition: A | Discharge status: Home / Self Care | Payer: BLUE CROSS/BLUE SHIELD | Attending: Internal Medicine | Admitting: Internal Medicine

## 2018-04-09 ENCOUNTER — Other Ambulatory Visit: Payer: Self-pay

## 2018-04-09 DIAGNOSIS — K648 Other hemorrhoids: Principal | ICD-10-CM | POA: Diagnosis present

## 2018-04-09 DIAGNOSIS — J309 Allergic rhinitis, unspecified: Secondary | ICD-10-CM | POA: Diagnosis present

## 2018-04-09 DIAGNOSIS — E039 Hypothyroidism, unspecified: Secondary | ICD-10-CM | POA: Diagnosis not present

## 2018-04-09 DIAGNOSIS — K625 Hemorrhage of anus and rectum: Secondary | ICD-10-CM

## 2018-04-09 DIAGNOSIS — K922 Gastrointestinal hemorrhage, unspecified: Secondary | ICD-10-CM | POA: Diagnosis present

## 2018-04-09 DIAGNOSIS — R197 Diarrhea, unspecified: Secondary | ICD-10-CM

## 2018-04-09 DIAGNOSIS — Z880 Allergy status to penicillin: Secondary | ICD-10-CM

## 2018-04-09 LAB — COMPREHENSIVE METABOLIC PANEL
ALT: 28 U/L (ref 17–63)
ANION GAP: 8 (ref 5–15)
AST: 21 U/L (ref 15–41)
Albumin: 4.7 g/dL (ref 3.5–5.0)
Alkaline Phosphatase: 77 U/L (ref 38–126)
BILIRUBIN TOTAL: 0.7 mg/dL (ref 0.3–1.2)
BUN: 14 mg/dL (ref 6–20)
CO2: 28 mmol/L (ref 22–32)
CREATININE: 1.04 mg/dL (ref 0.61–1.24)
Calcium: 9.3 mg/dL (ref 8.9–10.3)
Chloride: 103 mmol/L (ref 101–111)
Glucose, Bld: 127 mg/dL — ABNORMAL HIGH (ref 65–99)
POTASSIUM: 3.8 mmol/L (ref 3.5–5.1)
Sodium: 139 mmol/L (ref 135–145)
Total Protein: 7.4 g/dL (ref 6.5–8.1)

## 2018-04-09 LAB — TYPE AND SCREEN
ABO/RH(D): O POS
Antibody Screen: NEGATIVE

## 2018-04-09 LAB — CBC
HCT: 44.4 % (ref 40.0–52.0)
Hemoglobin: 15.5 g/dL (ref 13.0–18.0)
MCH: 29.8 pg (ref 26.0–34.0)
MCHC: 35 g/dL (ref 32.0–36.0)
MCV: 85.2 fL (ref 80.0–100.0)
PLATELETS: 201 10*3/uL (ref 150–440)
RBC: 5.22 MIL/uL (ref 4.40–5.90)
RDW: 13.2 % (ref 11.5–14.5)
WBC: 6 10*3/uL (ref 3.8–10.6)

## 2018-04-09 NOTE — Telephone Encounter (Signed)
It's possible that band may have fell off and he is noticing some blood. Shouldn't have diarrhea, but if he continues to have diarrhea mixed with stools, recommend stool studies. He went to [place] on cruise recently, so possible that he may have contracted any bug  Thanks [employer]

## 2018-04-09 NOTE — Telephone Encounter (Signed)
Patient states he had banding on on 03/26/18 and was feeling fine.  Today he went to the bathroom and had bloody diarrhea.  Please advise.  Thanks [employer]

## 2018-04-09 NOTE — ED Triage Notes (Signed)
Patient reports noticed rectal bleeding since approximately 1 pm.

## 2018-04-09 NOTE — Telephone Encounter (Signed)
He said he canceled his trip to the [place] because of the diarrhea.  Not so much concerned about the diarrhea but the blood, but if you think its just from the band falling off he will not worry about it.  I explained to him, although he did not go to the [place] he still could have picked up a bug.

## 2018-04-09 NOTE — ED Notes (Signed)
Cup of water provided to pt. 

## 2018-04-09 NOTE — ED Notes (Signed)
Pt reports having a banding procedure last Wed. To reduce hemorrhoids and bleeding had stopped the day of. Today after having a bowel movement, pt reports bleeding and liquid stool. Pt reports on pain.

## 2018-04-10 DIAGNOSIS — K648 Other hemorrhoids: Secondary | ICD-10-CM | POA: Diagnosis present

## 2018-04-10 DIAGNOSIS — J309 Allergic rhinitis, unspecified: Secondary | ICD-10-CM | POA: Diagnosis present

## 2018-04-10 DIAGNOSIS — K625 Hemorrhage of anus and rectum: Secondary | ICD-10-CM | POA: Diagnosis not present

## 2018-04-10 DIAGNOSIS — K922 Gastrointestinal hemorrhage, unspecified: Secondary | ICD-10-CM | POA: Diagnosis present

## 2018-04-10 DIAGNOSIS — E039 Hypothyroidism, unspecified: Secondary | ICD-10-CM | POA: Diagnosis present

## 2018-04-10 DIAGNOSIS — Z880 Allergy status to penicillin: Secondary | ICD-10-CM | POA: Diagnosis not present

## 2018-04-10 LAB — BASIC METABOLIC PANEL
Anion gap: 6 (ref 5–15)
BUN: 12 mg/dL (ref 6–20)
CALCIUM: 8.7 mg/dL — AB (ref 8.9–10.3)
CO2: 29 mmol/L (ref 22–32)
Chloride: 105 mmol/L (ref 101–111)
Creatinine, Ser: 0.95 mg/dL (ref 0.61–1.24)
GFR calc Af Amer: >60 mL/min (ref >60)
GFR calc non Af Amer: >60 mL/min (ref >60)
Glucose, Bld: 103 mg/dL — ABNORMAL HIGH (ref 65–99)
POTASSIUM: 4 mmol/L (ref 3.5–5.1)
SODIUM: 140 mmol/L (ref 135–145)

## 2018-04-10 LAB — CBC
HCT: 39.4 % — ABNORMAL LOW (ref 40.0–52.0)
Hemoglobin: 13.9 g/dL (ref 13.0–18.0)
MCH: 30 pg (ref 26.0–34.0)
MCHC: 35.4 g/dL (ref 32.0–36.0)
MCV: 84.9 fL (ref 80.0–100.0)
PLATELETS: 168 10*3/uL (ref 150–440)
RBC: 4.64 MIL/uL (ref 4.40–5.90)
RDW: 13 % (ref 11.5–14.5)
WBC: 4.5 10*3/uL (ref 3.8–10.6)

## 2018-04-10 LAB — HEMOGLOBIN AND HEMATOCRIT, BLOOD
HCT: 42.1 % (ref 40.0–52.0)
HEMOGLOBIN: 14.6 g/dL (ref 13.0–18.0)

## 2018-04-10 LAB — HEMOGLOBIN: Hemoglobin: 13.8 g/dL (ref 13.0–18.0)

## 2018-04-10 MED ORDER — ONDANSETRON HCL 4 MG PO TABS: 4.0000 mg | ORAL_TABLET | Freq: Four times a day (QID) | ORAL | Status: DC | PRN

## 2018-04-10 MED ORDER — ACETAMINOPHEN 650 MG RE SUPP: 650.0000 mg | Freq: Four times a day (QID) | RECTAL | Status: DC | PRN

## 2018-04-10 MED ORDER — LORATADINE 10 MG PO TABS
10.0000 mg | ORAL_TABLET | Freq: Every day | ORAL | Status: DC
  Administered 2018-04-10 – 2018-04-11 (×2): 10 mg via ORAL
  Filled 2018-04-10 (×2): qty 1

## 2018-04-10 MED ORDER — LEVOTHYROXINE SODIUM 50 MCG PO TABS: 175.0000 ug | ORAL_TABLET | ORAL | Status: DC

## 2018-04-10 MED ORDER — LEVOCETIRIZINE DIHYDROCHLORIDE 5 MG PO TABS: 5.0000 mg | ORAL_TABLET | Freq: Every day | ORAL | Status: DC

## 2018-04-10 MED ORDER — HEPARIN SODIUM (PORCINE) 5000 UNIT/ML IJ SOLN
5000.0000 [IU] | Freq: Three times a day (TID) | INTRAMUSCULAR | Status: DC
  Administered 2018-04-10: 5000 [IU] via SUBCUTANEOUS
  Filled 2018-04-10: qty 1

## 2018-04-10 MED ORDER — SODIUM CHLORIDE 0.9 % IV SOLN
INTRAVENOUS | Status: DC
  Administered 2018-04-10: 100 mL/h via INTRAVENOUS
  Administered 2018-04-10: 12:00:00 via INTRAVENOUS

## 2018-04-10 MED ORDER — BISACODYL 5 MG PO TBEC: 5.0000 mg | DELAYED_RELEASE_TABLET | Freq: Every day | ORAL | Status: DC | PRN

## 2018-04-10 MED ORDER — DOCUSATE SODIUM 100 MG PO CAPS
100.0000 mg | ORAL_CAPSULE | Freq: Two times a day (BID) | ORAL | Status: DC
  Administered 2018-04-10 – 2018-04-11 (×2): 100 mg via ORAL
  Filled 2018-04-10 (×2): qty 1

## 2018-04-10 MED ORDER — LEVOTHYROXINE SODIUM 100 MCG PO TABS
200.0000 ug | ORAL_TABLET | ORAL | Status: DC
Start: 2018-04-10 — End: 2018-04-11
  Administered 2018-04-10 – 2018-04-11 (×2): 200 ug via ORAL
  Filled 2018-04-10 (×2): qty 2

## 2018-04-10 MED ORDER — ONDANSETRON HCL 4 MG/2ML IJ SOLN: 4.0000 mg | Freq: Four times a day (QID) | INTRAMUSCULAR | Status: DC | PRN

## 2018-04-10 MED ORDER — HYDROCODONE-ACETAMINOPHEN 5-325 MG PO TABS: 1.0000 | ORAL_TABLET | ORAL | Status: DC | PRN

## 2018-04-10 MED ORDER — ADULT MULTIVITAMIN W/MINERALS CH
1.0000 | ORAL_TABLET | Freq: Every day | ORAL | Status: DC
  Administered 2018-04-10 – 2018-04-11 (×2): 1 via ORAL
  Filled 2018-04-10 (×2): qty 1

## 2018-04-10 MED ORDER — ACETAMINOPHEN 325 MG PO TABS: 650.0000 mg | ORAL_TABLET | Freq: Four times a day (QID) | ORAL | Status: DC | PRN

## 2018-04-10 NOTE — ED Provider Notes (Addendum)
[facility] Emergency Department Provider Note   ____________________________________________   First MD Initiated Contact with Patient 04/09/18 2141     (approximate)  I have reviewed the triage vital signs and the nursing notes.   HISTORY  Chief Complaint Rectal Bleeding    HPI Allen Long is a 32 y.o. male reports is been having notable amount of bleeding from the rectum.  Reports about a 9 days ago he had a internal hemorrhoid banded in the office.  This is afternoon at 1 PM he started experiencing crampy discomfort so he is going to have diarrhea and had a bowel movement that was made of blood and clots.  Reports that he has had about 1 bowel movement an hour now for the last 8 or 9 hours with blood and clots.  Is not slow down, but is not accelerated.  Said no lightheadedness no abdominal pain except for crampy discomfort before defecation.  Does not take any blood thinners.  This is not happened before.  No fevers or chills.  No chest pain or trouble breathing.  Past Medical History:  Diagnosis Date  . Hypothyroid     Patient Active Problem List   Diagnosis Date Noted  . Generalized anxiety disorder 03/31/2018  . Hypothyroid     Past Surgical History:  Procedure Laterality Date  . APPENDECTOMY    . FRONTAL SINUS EXPLORATION Bilateral 10/15/2017   Procedure: FRONTAL SINUS EXPLORATION;  Surgeon: Margaretha Sheffield, MD;  Location: [facility];  Service: ENT;  Laterality: Bilateral;  . HYPOSPADIAS CORRECTION     as infant  . IMAGE GUIDED SINUS SURGERY Bilateral 10/15/2017   Procedure: IMAGE GUIDED SINUS SURGERY;  Surgeon: Margaretha Sheffield, MD;  Location: [facility];  Service: ENT;  Laterality: Bilateral;  . MASS EXCISION     scar tissue from prior surgery  . NASAL SEPTOPLASTY W/ TURBINOPLASTY Bilateral 10/15/2017   Procedure: NASAL SEPTOPLASTY WITH TURBINATE REDUCTION;  Surgeon: Margaretha Sheffield, MD;  Location: [facility];   Service: ENT;  Laterality: Bilateral;  . SEPTOPLASTY WITH ETHMOIDECTOMY, AND MAXILLARY ANTROSTOMY Bilateral 10/15/2017   Procedure: SEPTOPLASTY WITH ETHMOIDECTOMY, AND MAXILLARY ANTROSTOMY;  Surgeon: Margaretha Sheffield, MD;  Location: [facility];  Service: ENT;  Laterality: Bilateral;  . SPHENOIDECTOMY Bilateral 10/15/2017   Procedure: SPHENOIDECTOMY;  Surgeon: Margaretha Sheffield, MD;  Location: [facility];  Service: ENT;  Laterality: Bilateral;    Prior to Admission medications   Medication Sig Start Date End Date Taking? Authorizing Provider  hydrocortisone (ANUSOL-HC) 25 MG suppository Place 1 suppository (25 mg total) rectally 2 (two) times daily. Patient not taking: Reported on 03/31/2018 03/10/18   Copland, Frederico Hamman, MD  levocetirizine (XYZAL) 5 MG tablet Take 5 mg by mouth every evening.    [provider]  levothyroxine (SYNTHROID, LEVOTHROID) 175 MCG tablet Take 175 mcg by mouth daily before breakfast. Mon - Fri    [provider]  levothyroxine (SYNTHROID, LEVOTHROID) 200 MCG tablet Take 200 mcg by mouth daily before breakfast. Sat, Sun    [provider]  Multiple Vitamin (MULTIVITAMIN) tablet Take 1 tablet by mouth daily.    [provider]  Omega-3 Fatty Acids (FISH OIL PO) Take by mouth daily.    [provider]  Truett Perna 93 MCG/ACT EXHU Place 1 spray into both nostrils 2 (two) times daily. 03/08/18   [provider]    Allergies Penicillins  No family history on file.  Social History Social History   Tobacco Use  .  Smoking status: Never Smoker  . Smokeless tobacco: Never Used  Substance Use Topics  . Alcohol use: Yes    Alcohol/week: 1.8 oz    Types: 1 Glasses of wine, 2 Cans of beer per week  . Drug use: No    Review of Systems Constitutional: No fever/chills Eyes: No visual changes. Cardiovascular: Denies chest pain. Respiratory: Denies shortness of breath. Gastrointestinal: No abdominal pain.  No  nausea, no vomiting.   No constipation. Skin: Negative for rash. Neurological: Negative for headaches, focal weakness or numbness.    ____________________________________________   PHYSICAL EXAM:  VITAL SIGNS: ED Triage Vitals  Enc Vitals Group     BP 04/09/18 2005 (!) 157/80     Pulse Rate 04/09/18 2005 (!) 112     Resp 04/09/18 2005 18     Temp 04/09/18 2005 98.6 F (37 C)     Temp Source 04/09/18 2005 Oral     SpO2 04/09/18 2005 99 %     Weight --      Height --      Head Circumference --      Peak Flow --      Pain Score 04/09/18 2001 0     Pain Loc --      Pain Edu? --      Excl. in [place]? --     Constitutional: Alert and oriented. Well appearing and in no acute distress. Eyes: Conjunctivae are normal. Head: Atraumatic. Nose: No congestion/rhinnorhea. Mouth/Throat: Mucous membranes are moist. Neck: No stridor.   Cardiovascular: Normal rate, regular rhythm. Grossly normal heart sounds.  Good peripheral circulation. Respiratory: Normal respiratory effort.  No retractions. Lungs CTAB. Gastrointestinal: Soft and nontender. No distention. Rectal exam demonstrates gross blood, relatively small amount in the vault.  The patient did have a bloody bowel movement in my presence, about 1 cup of mixed blood and clot.  Reports he had the same about 8 times. Musculoskeletal: No lower extremity tenderness nor edema. Neurologic:  Normal speech and language. No gross focal neurologic deficits are appreciated.  Skin:  Skin is warm, dry and intact. No rash noted. Psychiatric: Mood and affect are normal. Speech and behavior are normal.  ____________________________________________   LABS (all labs ordered are listed, but only abnormal results are displayed)  Labs Reviewed  COMPREHENSIVE METABOLIC PANEL - Abnormal; Notable for the following components:      Result Value   Glucose, Bld 127 (*)    All other components within normal limits  CBC  HEMOGLOBIN AND HEMATOCRIT, BLOOD    TYPE AND SCREEN   ____________________________________________  EKG   ____________________________________________  RADIOLOGY   ____________________________________________   PROCEDURES  Procedure(s) performed: None  Procedures  Critical Care performed: No  ____________________________________________   INITIAL IMPRESSION / ASSESSMENT AND PLAN / ED COURSE  Pertinent labs & imaging results that were available during my care of the patient were reviewed by me and considered in my medical decision making (see chart for details).  Rectal bleeding.  Likely secondary to recent hemorrhoid banding.  No infectious symptoms.  [place] is not any travel history, had intended to but did not.  Not on any anticoagulants.  Hemoglobin stable and hemodynamic stable, but he has not had improvement in his symptoms and is been bleeding what appears to be about a couple of blood or less diarrhea each hour.  We will continue to monitor his hemoglobin is closely, and discussed with GI and we will admit  the patient for serial hemoglobin monitoring and GI consultation.  Clinical Course as of Apr 13 1635  Fri Apr 09, 2018  2221 Dr. Jeralene Peters (GI) advises offer admit for observation and GI consultation in AM. If stable, no emergent intervention but needs obs. Consider surgical consult if concern patient is or becomes unstable. Currently appears very stable but having ongoing bleeding.    [MQ]  Sat Apr 10, 2018  0030 Dr. Bjorn Loser admitting for hospitalist service.   [MQ]    Clinical Course User Index [MQ] Delman Kitten, MD   Discussed case with Dr. Burt Knack general surgery, is made aware of the patient and the potential need for surgical consult if the patient were to become unstable.  ----------------------------------------- 12:27 AM on 04/10/2018 -----------------------------------------  Patient awaiting admission to hospital.  Have ordered a repeat hemoglobin  hematocrit at this time.  Remains him dynamically stable  ____________________________________________   FINAL CLINICAL IMPRESSION(S) / ED DIAGNOSES  Final diagnoses:  Rectal bleeding      NEW MEDICATIONS STARTED DURING THIS VISIT:  New Prescriptions   No medications on file     Note:  This document was prepared using Dragon voice recognition software and may include unintentional dictation errors.     Delman Kitten, MD 04/10/18 Allen Long    Delman Kitten, MD 04/12/18 [phone]

## 2018-04-10 NOTE — Progress Notes (Signed)
Patient ID: Allen Long, male   DOB: Jun 26, 1986, 32 y.o.   MRN: [id number]  [facility] PROGRESS NOTE  GUSTAVO DISPENZA BOF:[id number] DOB: [birth year] DOA: 04/09/2018 PCP: Owens Loffler, MD  HPI/Subjective: Patient had a bowel movement this morning which was solid and did have only a touch of blood on it.  Prior to that did have quite a bit of blood.  Patient feeling okay at this point.  No pain.  Objective: Vitals:   04/10/18 0201 04/10/18 0737  BP: (!) 141/98 118/83  Pulse: 91 88  Resp: 18   Temp: 97.7 F (36.5 C) 97.8 F (36.6 C)  SpO2: 99% 100%    Filed Weights   04/10/18 0201  Weight: 82.1 kg (181 lb)    ROS: Review of Systems  Constitutional: Negative for chills and fever.  Eyes: Negative for blurred vision.  Respiratory: Negative for cough and shortness of breath.   Cardiovascular: Negative for chest pain.  Gastrointestinal: Positive for blood in stool. Negative for abdominal pain, constipation, diarrhea, nausea and vomiting.  Genitourinary: Negative for dysuria.  Musculoskeletal: Negative for joint pain.  Neurological: Negative for dizziness and headaches.   Exam: Physical Exam  Constitutional: He is oriented to person, place, and time.  HENT:  Nose: No mucosal edema.  Mouth/Throat: No oropharyngeal exudate or posterior oropharyngeal edema.  Eyes: Pupils are equal, round, and reactive to light. Conjunctivae, EOM and lids are normal.  Neck: No JVD present. Carotid bruit is not present. No edema present. No thyroid mass and no thyromegaly present.  Cardiovascular: S1 normal and S2 normal. Exam reveals no gallop.  No murmur heard. Pulses:      Dorsalis pedis pulses are 2+ on the right side, and 2+ on the left side.  Respiratory: No respiratory distress. He has no wheezes. He has no rhonchi. He has no rales.  GI: Soft. Bowel sounds are normal. There is no tenderness.  Musculoskeletal:       Right ankle: He exhibits no swelling.       Left ankle: He exhibits  no swelling.  Lymphadenopathy:    He has no cervical adenopathy.  Neurological: He is alert and oriented to person, place, and time. No cranial nerve deficit.  Skin: Skin is warm. No rash noted. Nails show no clubbing.  Psychiatric: He has a normal mood and affect.      Data Reviewed: Basic Metabolic Panel: Recent Labs  Lab 04/09/18 2010 04/10/18 0411  NA 139 140  K 3.8 4.0  CL 103 105  CO2 28 29  GLUCOSE 127* 103*  BUN 14 12  CREATININE 1.04 0.95  CALCIUM 9.3 8.7*   Liver Function Tests: Recent Labs  Lab 04/09/18 2010  AST 21  ALT 28  ALKPHOS 77  BILITOT 0.7  PROT 7.4  ALBUMIN 4.7   CBC: Recent Labs  Lab 04/09/18 2010 04/10/18 0105 04/10/18 0411  WBC 6.0  --  4.5  HGB 15.5 14.6 13.9  HCT 44.4 42.1 39.4*  MCV 85.2  --  84.9  PLT 201  --  168    Scheduled Meds: . docusate sodium  100 mg Oral BID  . [START ON 04/12/2018] levothyroxine  175 mcg Oral Once per day on Mon Tue Wed Thu Fri  . levothyroxine  200 mcg Oral Once per day on Sun Sat  . loratadine  10 mg Oral Daily  . multivitamin with minerals  1 tablet Oral Daily   Continuous Infusions:  Assessment/Plan:  1. Hemorrhoidal bleeding.  Complication from hemorrhoidal banding.  Case discussed with general surgery and gastroenterology.  The gastroenterologist on-call does not do banding of hemorrhoids.  The surgeon on-call will not see the patient unless the bleeding is severe.  Continue to monitor hemoglobin.  Advance diet. 2. Hypothyroidism unspecified on levothyroxine 3. Allergic rhinitis on loratadine  Code Status:     Code Status Orders  (From admission, onward)        Start     Ordered   04/10/18 0203  Full code  Continuous     04/10/18 0202    Code Status History    This patient has a current code status but no historical code status.      Disposition Plan: Hopefully home tomorrow  Consultants:  Gastroenterology  Time spent: 28 minutes.  Case discussed with general surgery and  gastroenterology  [facility]

## 2018-04-10 NOTE — Progress Notes (Addendum)
The ER called to inform me that this patient presents with BRBPR and is hemodynamically stable. I have not seen the patient yet, but chart review reveals: Patient had in office banding procedure by Dr. Marius Ditch on 03/31/18, 10 days ago and now presents with normal/stable Hemoglobin but bright red blood per rectum. As Dr. Verlin Grills telephone note from yesterday, this may be due to the band falling off. If his active bleeding does not resolve, he will need surgical intervention/hemorrhoidectomy. I have placed a surgical consult to this affect. No therapeutic benefit of endoscopy at this time. If bleeding resolves, patient should follow up with Dr. Marius Ditch in clinic.   If patient has diarrhea, primary team to please order stool for C. Diff and stool GI panel.   Continue Serial CBCs  Patient's nurse states BRBPR is much improved today with only minimal blood after brown BM this morning.  If patient continues to have active GI bleeding, primary team to inform GI and surgery.   Above discussed with Dr. Leslye Peer and Dr. Rosana Hoes from surgery as well.

## 2018-04-10 NOTE — H&P (Signed)
[facility] [place] NAME: Allen Long    MR#:  [id number]  DATE OF BIRTH:  [birth year]  DATE OF ADMISSION:  04/09/2018  PRIMARY CARE PHYSICIAN: Owens Loffler, MD   REQUESTING/REFERRING PHYSICIAN:   CHIEF COMPLAINT:   Chief Complaint  Patient presents with  . Rectal Bleeding    HISTORY OF PRESENT ILLNESS: Allen Long  is a 32 y.o. male, status post internal hemorrhoids banded, just few days ago, by GI. Patient resented to emergency room for large amount of bright red blood per rectum, started suddenly around 1 PM yesterday.  He denies any nausea, vomiting or abdominal pain.  No fever or chills.  No dizziness.  He has had approximately 1 bowel movement with blood clots per hour in the past 12 hours.  Similar episodes in the past. Blood test done emergency room, including CBC and CMP are essentially unremarkable. Patient is admitted for further evaluation and treatment.   PAST MEDICAL HISTORY:   Past Medical History:  Diagnosis Date  . Hypothyroid     PAST SURGICAL HISTORY:  Past Surgical History:  Procedure Laterality Date  . APPENDECTOMY    . FRONTAL SINUS EXPLORATION Bilateral 10/15/2017   Procedure: FRONTAL SINUS EXPLORATION;  Surgeon: Margaretha Sheffield, MD;  Location: [place];  Service: ENT;  Laterality: Bilateral;  . HYPOSPADIAS CORRECTION     as infant  . IMAGE GUIDED SINUS SURGERY Bilateral 10/15/2017   Procedure: IMAGE GUIDED SINUS SURGERY;  Surgeon: Margaretha Sheffield, MD;  Location: [place];  Service: ENT;  Laterality: Bilateral;  . MASS EXCISION     scar tissue from prior surgery  . NASAL SEPTOPLASTY W/ TURBINOPLASTY Bilateral 10/15/2017   Procedure: NASAL SEPTOPLASTY WITH TURBINATE REDUCTION;  Surgeon: Margaretha Sheffield, MD;  Location: [facility];  Service: ENT;  Laterality: Bilateral;  . SEPTOPLASTY WITH ETHMOIDECTOMY, AND MAXILLARY ANTROSTOMY Bilateral 10/15/2017   Procedure: SEPTOPLASTY  WITH ETHMOIDECTOMY, AND MAXILLARY ANTROSTOMY;  Surgeon: Margaretha Sheffield, MD;  Location: [place];  Service: ENT;  Laterality: Bilateral;  . SPHENOIDECTOMY Bilateral 10/15/2017   Procedure: SPHENOIDECTOMY;  Surgeon: Margaretha Sheffield, MD;  Location: [place];  Service: ENT;  Laterality: Bilateral;    SOCIAL HISTORY:  Social History   Tobacco Use  . Smoking status: Never Smoker  . Smokeless tobacco: Never Used  Substance Use Topics  . Alcohol use: Yes    Alcohol/week: 1.8 oz    Types: 1 Glasses of wine, 2 Cans of beer per week    FAMILY HISTORY: No family history on file.  DRUG ALLERGIES:  Allergies  Allergen Reactions  . Penicillins     REACTION: Rash as a child (has had Augmentin and Keflex with NO reaction)    REVIEW OF SYSTEMS:   CONSTITUTIONAL: No fever, fatigue or weakness.  EYES: No blurred or double vision.  EARS, NOSE, AND THROAT: No tinnitus or ear pain.  RESPIRATORY: No cough, shortness of breath, wheezing or hemoptysis.  CARDIOVASCULAR: No chest pain, orthopnea, edema.  GASTROINTESTINAL: No nausea, vomiting, diarrhea or abdominal pain.  GENITOURINARY: No dysuria, hematuria.  ENDOCRINE: No polyuria, nocturia,  HEMATOLOGY: Positive for blood in stool SKIN: No rash or lesion. MUSCULOSKELETAL: No joint pain or arthritis.   NEUROLOGIC: No tingling, numbness, weakness.  PSYCHIATRY: No anxiety or depression.   MEDICATIONS AT HOME:  Prior to Admission medications   Medication Sig Start Date End Date Taking? Authorizing Provider  levocetirizine (XYZAL) 5 MG tablet Take 5 mg by mouth  daily.    Yes [provider]  levothyroxine (SYNTHROID, LEVOTHROID) 175 MCG tablet Take 175 mcg by mouth daily before breakfast. Mon - Fri   Yes [provider]  levothyroxine (SYNTHROID, LEVOTHROID) 200 MCG tablet Take 200 mcg by mouth daily before breakfast. Sat, Sun   Yes [provider]  Multiple Vitamin (MULTIVITAMIN) tablet Take 1 tablet by  mouth daily.   Yes [provider]  Omega-3 Fatty Acids (FISH OIL PO) Take 1 capsule by mouth daily.    Yes [provider]  Truett Perna 93 MCG/ACT EXHU Place 1 spray into both nostrils 2 (two) times daily. 03/08/18  Yes [provider]  hydrocortisone (ANUSOL-HC) 25 MG suppository Place 1 suppository (25 mg total) rectally 2 (two) times daily. Patient not taking: Reported on 03/31/2018 03/10/18   Copland, Frederico Hamman, MD      PHYSICAL EXAMINATION:   VITAL SIGNS: Blood pressure (!) 141/98, pulse 91, temperature 97.7 F (36.5 C), temperature source Oral, resp. rate 18, height 6\' 1"  (1.854 m), weight 82.1 kg (181 lb), SpO2 99 %.  GENERAL:  32 y.o.-year-old patient lying in the bed with no acute distress.  EYES: Pupils equal, round, reactive to light and accommodation. No scleral icterus. Extraocular muscles intact.  HEENT: Head atraumatic, normocephalic. Oropharynx and nasopharynx clear.  NECK:  Supple, no jugular venous distention. No thyroid enlargement, no tenderness.  LUNGS: Normal breath sounds bilaterally, no wheezing, rales,rhonchi or crepitation. No use of accessory muscles of respiration.  CARDIOVASCULAR: S1, S2 normal. No murmurs, rubs, or gallops.  ABDOMEN: Soft, nontender, nondistended. Bowel sounds present. No organomegaly or mass.  EXTREMITIES: No pedal edema, cyanosis, or clubbing.  NEUROLOGIC: Cranial nerves II through XII are intact. Muscle strength 5/5 in all extremities. Sensation intact. Gait not checked.  PSYCHIATRIC: The patient is alert and oriented x 3.  SKIN: No obvious rash, lesion, or ulcer.   LABORATORY PANEL:   CBC Recent Labs  Lab 04/09/18 2010 04/10/18 0105 04/10/18 0411  WBC 6.0  --  4.5  HGB 15.5 14.6 13.9  HCT 44.4 42.1 39.4*  PLT 201  --  168  MCV 85.2  --  84.9  MCH 29.8  --  30.0  MCHC 35.0  --  35.4  RDW 13.2  --  13.0    ------------------------------------------------------------------------------------------------------------------  Chemistries  Recent Labs  Lab 04/09/18 2010 04/10/18 0411  NA 139 140  K 3.8 4.0  CL 103 105  CO2 28 29  GLUCOSE 127* 103*  BUN 14 12  CREATININE 1.04 0.95  CALCIUM 9.3 8.7*  AST 21  --   ALT 28  --   ALKPHOS 77  --   BILITOT 0.7  --    ------------------------------------------------------------------------------------------------------------------ estimated creatinine clearance is 126.2 mL/min (by C-G formula based on SCr of 0.95 mg/dL). ------------------------------------------------------------------------------------------------------------------ No results for input(s): TSH, T4TOTAL, T3FREE, THYROIDAB in the last 72 hours.  Invalid input(s): FREET3   Coagulation profile No results for input(s): INR, PROTIME in the last 168 hours. ------------------------------------------------------------------------------------------------------------------- No results for input(s): DDIMER in the last 72 hours. -------------------------------------------------------------------------------------------------------------------  Cardiac Enzymes No results for input(s): CKMB, TROPONINI, MYOGLOBIN in the last 168 hours.  Invalid input(s): CK ------------------------------------------------------------------------------------------------------------------ Invalid input(s): POCBNP  ---------------------------------------------------------------------------------------------------------------  Urinalysis No results found for: COLORURINE, APPEARANCEUR, LABSPEC, PHURINE, GLUCOSEU, HGBUR, BILIRUBINUR, KETONESUR, PROTEINUR, UROBILINOGEN, NITRITE, LEUKOCYTESUR   RADIOLOGY: No results found.  EKG: No orders found for this or any previous visit.  IMPRESSION AND PLAN:  1.  Lower GI bleed, status post recent internal hemorrhoid  banded, 2 days ago.  Blood pressure and  hemoglobin level are currently within normal limits.  We will start IV fluids and monitor hemoglobin level closely.  Gastroenterology is consulted for further evaluation and treatment. 2.  Hypothyroidism, stable, continue supplement with Synthroid.  All the records are reviewed and case discussed with ED provider. Management plans discussed with the patient and he is in agreement.  CODE STATUS: Full    Code Status Orders  (From admission, onward)        Start     Ordered   04/10/18 0203  Full code  Continuous     04/10/18 0202    Code Status History    This patient has a current code status but no historical code status.       TOTAL TIME TAKING CARE OF THIS PATIENT: 45 minutes.    Amelia Jo M.D on 04/10/2018 at 6:18 AM  Between 7am to 6pm - Pager - [phone]  After 6pm go to [email or web address] - password [email or web address]  Office  [phone]  CC: Primary care physician; Owens Loffler, MD

## 2018-04-11 DIAGNOSIS — K625 Hemorrhage of anus and rectum: Secondary | ICD-10-CM

## 2018-04-11 LAB — HEMOGLOBIN: HEMOGLOBIN: 13.6 g/dL (ref 13.0–18.0)

## 2018-04-11 LAB — GLUCOSE, CAPILLARY: GLUCOSE-CAPILLARY: 78 mg/dL (ref 65–99)

## 2018-04-11 MED ORDER — HYDROCORTISONE ACETATE 25 MG RE SUPP
25.0000 mg | Freq: Two times a day (BID) | RECTAL | Status: DC | PRN
  Filled 2018-04-11: qty 1

## 2018-04-11 MED ORDER — HYDROCORTISONE ACETATE 25 MG RE SUPP: 25.0000 mg | Freq: Two times a day (BID) | RECTAL | 0 refills | Status: DC | PRN

## 2018-04-11 NOTE — Plan of Care (Signed)
  Problem: Education: Goal: Knowledge of General Education information will improve Outcome: Progressing   Problem: Health Behavior/Discharge Planning: Goal: Ability to manage health-related needs will improve Outcome: Progressing   Problem: Clinical Measurements: Goal: Ability to maintain clinical measurements within normal limits will improve Outcome: Progressing Goal: Will remain free from infection Outcome: Progressing Goal: Diagnostic test results will improve Outcome: Progressing Goal: Respiratory complications will improve Outcome: Progressing Goal: Cardiovascular complication will be avoided Outcome: Progressing   Problem: Nutrition: Goal: Adequate nutrition will be maintained Outcome: Progressing   Problem: Activity: Goal: Risk for activity intolerance will decrease Outcome: Progressing   Problem: Coping: Goal: Level of anxiety will decrease Outcome: Progressing   Problem: Elimination: Goal: Will not experience complications related to bowel motility Outcome: Progressing Goal: Will not experience complications related to urinary retention Outcome: Progressing   

## 2018-04-11 NOTE — Consult Note (Addendum)
Vonda Antigua, MD [address] [place], [place], [place], [address] [address], [place], [place], [place], [address] Phone: [phone]  Fax: [phone]  Consultation  Referring Provider:     Dr. Leslye Peer Primary Care Physician:  Owens Loffler, MD Reason for Consultation:     Bright blood per rectum  Date of Admission:  04/09/2018 Date of Consultation:  04/11/2018         HPI:   Allen Long is a 32 y.o. male who underwent hemorrhoid banding, and office procedure, with Dr. Marius Ditch on May 15, and presented with bright blood per rectum ongoing for 1 to 2 days.  Patient reports after every bowel movement he started to have bright red blood.  The initial 1-2 episodes consisted of a lot of bright red blood, which prompted him to come to the hospital.  He denies any diarrhea, reports bowel movements were formed, not hard, and was somewhat soft.  Denies any abdominal pain, no nausea or vomiting, no fever or chills.  Last bowel movement was yesterday evening at 5 PM, was formed, and followed by minimal amount of blood compared to, the initial episodes that prompted him to come to the hospital.  A bowel movement before that was yesterday morning, and was similar to the one at 5 PM.  His hemoglobin has been normal and stable throughout the hospital stay.  Past Medical History:  Diagnosis Date  . Hypothyroid     Past Surgical History:  Procedure Laterality Date  . APPENDECTOMY    . FRONTAL SINUS EXPLORATION Bilateral 10/15/2017   Procedure: FRONTAL SINUS EXPLORATION;  Surgeon: Margaretha Sheffield, MD;  Location: [facility];  Service: ENT;  Laterality: Bilateral;  . HYPOSPADIAS CORRECTION     as infant  . IMAGE GUIDED SINUS SURGERY Bilateral 10/15/2017   Procedure: IMAGE GUIDED SINUS SURGERY;  Surgeon: Margaretha Sheffield, MD;  Location: [facility];  Service: ENT;  Laterality: Bilateral;  . MASS EXCISION     scar tissue from prior surgery  . NASAL SEPTOPLASTY W/  TURBINOPLASTY Bilateral 10/15/2017   Procedure: NASAL SEPTOPLASTY WITH TURBINATE REDUCTION;  Surgeon: Margaretha Sheffield, MD;  Location: [facility];  Service: ENT;  Laterality: Bilateral;  . SEPTOPLASTY WITH ETHMOIDECTOMY, AND MAXILLARY ANTROSTOMY Bilateral 10/15/2017   Procedure: SEPTOPLASTY WITH ETHMOIDECTOMY, AND MAXILLARY ANTROSTOMY;  Surgeon: Margaretha Sheffield, MD;  Location: [facility];  Service: ENT;  Laterality: Bilateral;  . SPHENOIDECTOMY Bilateral 10/15/2017   Procedure: SPHENOIDECTOMY;  Surgeon: Margaretha Sheffield, MD;  Location: [facility];  Service: ENT;  Laterality: Bilateral;    Prior to Admission medications   Medication Sig Start Date End Date Taking? Authorizing Provider  levocetirizine (XYZAL) 5 MG tablet Take 5 mg by mouth daily.    Yes [provider]  levothyroxine (SYNTHROID, LEVOTHROID) 175 MCG tablet Take 175 mcg by mouth daily before breakfast. Mon - Fri   Yes [provider]  levothyroxine (SYNTHROID, LEVOTHROID) 200 MCG tablet Take 200 mcg by mouth daily before breakfast. Sat, Sun   Yes [provider]  Multiple Vitamin (MULTIVITAMIN) tablet Take 1 tablet by mouth daily.   Yes [provider]  Omega-3 Fatty Acids (FISH OIL PO) Take 1 capsule by mouth daily.    Yes [provider]  Truett Perna 93 MCG/ACT EXHU Place 1 spray into both nostrils 2 (two) times daily. 03/08/18  Yes [provider]  hydrocortisone (ANUSOL-HC) 25 MG suppository Place 1 suppository (25 mg total) rectally 2 (two) times daily. Patient  not taking: Reported on 03/31/2018 03/10/18   Copland, Frederico Hamman, MD  hydrocortisone (ANUSOL-HC) 25 MG suppository Place 1 suppository (25 mg total) rectally 2 (two) times daily as needed for hemorrhoids. Okay to substitute what is covered by insurance 04/11/18   Loletha Grayer, MD    History reviewed. No pertinent family history.   Social History   Tobacco Use  . Smoking status: Never Smoker  .  Smokeless tobacco: Never Used  Substance Use Topics  . Alcohol use: Yes    Alcohol/week: 1.8 oz    Types: 1 Glasses of wine, 2 Cans of beer per week  . Drug use: No    Allergies as of 04/09/2018 - Review Complete 04/09/2018  Allergen Reaction Noted  . Penicillins      Review of Systems:    All systems reviewed and negative except where noted in HPI.   Physical Exam:  Vital signs in last 24 hours: Vitals:   04/10/18 1517 04/10/18 2327 04/11/18 0459 04/11/18 0804  BP: (!) 148/88 114/75  (!) 150/98  Pulse: 80 69  87  Resp:  17    Temp: 97.9 F (36.6 C) 97.6 F (36.4 C)  97.8 F (36.6 C)  TempSrc: Oral Oral  Oral  SpO2: 100% 98%  100%  Weight:   182 lb 14.4 oz (83 kg)   Height:       Last BM Date: 04/10/18 General:   Pleasant, cooperative in NAD Head:  Normocephalic and atraumatic. Eyes:   No icterus.   Conjunctiva pink. PERRLA. Ears:  Normal auditory acuity. Neck:  Supple; no masses or thyroidomegaly Lungs: Respirations even and unlabored. Lungs clear to auscultation bilaterally.   No wheezes, crackles, or rhonchi.  Abdomen:  Soft, nondistended, nontender. Normal bowel sounds. No appreciable masses or hepatomegaly.  No rebound or guarding.  Rectal exam: No external hemorrhoids, digital rectal exam showed brown stool, with no blood Neurologic:  Alert and oriented x3;  grossly normal neurologically. Skin:  Intact without significant lesions or rashes. Cervical Nodes:  No significant cervical adenopathy. Psych:  Alert and cooperative. Normal affect.  LAB RESULTS: Recent Labs    04/09/18 2010 04/10/18 0105 04/10/18 0411 04/10/18 1534 04/11/18 0436  WBC 6.0  --  4.5  --   --   HGB 15.5 14.6 13.9 13.8 13.6  HCT 44.4 42.1 39.4*  --   --   PLT 201  --  168  --   --    BMET Recent Labs    04/09/18 2010 04/10/18 0411  NA 139 140  K 3.8 4.0  CL 103 105  CO2 28 29  GLUCOSE 127* 103*  BUN 14 12  CREATININE 1.04 0.95  CALCIUM 9.3 8.7*   LFT Recent Labs     04/09/18 2010  PROT 7.4  ALBUMIN 4.7  AST 21  ALT 28  ALKPHOS 77  BILITOT 0.7   PT/INR No results for input(s): LABPROT, INR in the last 72 hours.  STUDIES: No results found.    Impression / Plan:   Allen Long is a 32 y.o. y/o male with bright red blood per rectum, with hemorrhoid banding done on May 15  blood per rectum is now improving and resolving It was likely a cause of the band placed on May 15 falling off.  Patient denies any pain, last 2 bowel movements had minimal amount of blood Rectal exam today, was negative for any blood Patient is improving as expected Hemoglobin has been stable throughout the stay No  indication for endoscopic intervention, as it would have limited therapeutic benefit Patient should follow-up with Dr. Marius Ditch as an outpatient as long as he continues to improve like he is If bright red blood per rectum worsens or does not improve, patient is to call us, and he verbalized understanding of this I have asked him to pick up some MiraLAX over-the-counter, with a goal of 1-2 soft problems daily he is to increase this dose to twice daily instead of once daily, if he does not have soft bowel movements every day.Marland Kitchen  He has verbalized understanding of this Continue high-fiber diet  I have asked the patient to call our office, he has the number, to make an appointment with Dr. Marius Ditch this week.  I will notify the office staff to call him as well.  Thank you for involving me in the care of this patient.      LOS: 1 day   Virgel Manifold, MD  04/11/2018, 8:55 AM

## 2018-04-11 NOTE — Progress Notes (Signed)
Patient is being discharged to home today. DC & Rx instructions given and patient acknowledged understanding. IV removed. Belongings packed.  Parents are here to transport patient home via private vehicle. NT wheeled patient to car.

## 2018-04-11 NOTE — Discharge Summary (Signed)
[facility] [place] NAME: Allen Long    MR#:  [id number]  DATE OF BIRTH:  1985/12/25  DATE OF ADMISSION:  04/09/2018 ADMITTING PHYSICIAN: Amelia Jo, MD  DATE OF DISCHARGE: 04/11/2018 10:54 AM  PRIMARY CARE PHYSICIAN: Owens Loffler, MD    ADMISSION DIAGNOSIS:  Rectal bleeding [K62.5]  DISCHARGE DIAGNOSIS:  Active Problems:   GIB (gastrointestinal bleeding)   SECONDARY DIAGNOSIS:   Past Medical History:  Diagnosis Date  . Hypothyroid     HOSPITAL COURSE:   1.  Hemorrhoidal bleeding.  Patient recently had a hemorrhoidal banding procedure by Dr. Marius Ditch as outpatient.  The likely cause of the bleeding is 1 of the bands likely fell off.  Follow-up with Dr. Marius Ditch as outpatient.  Patient was seen in consultation by Dr. Bonna Gains.  I prescribed Anusol suppositories upon going home.  Hemoglobin remained relatively stable for the last 3 hemoglobins.  Bleeding has settled down. 2.  Hypothyroidism unspecified on levothyroxine 3.  Allergic rhinitis on loratadine  DISCHARGE CONDITIONS:   Satisfactory  CONSULTS OBTAINED:  Treatment Team:  Virgel Manifold, MD  DRUG ALLERGIES:   Allergies  Allergen Reactions  . Penicillins     REACTION: Rash as a child (has had Augmentin and Keflex with NO reaction)    DISCHARGE MEDICATIONS:   Allergies as of 04/11/2018      Reactions   Penicillins    REACTION: Rash as a child (has had Augmentin and Keflex with NO reaction)      Medication List    TAKE these medications   FISH OIL PO Take 1 capsule by mouth daily.   hydrocortisone 25 MG suppository Commonly known as:  ANUSOL-HC Place 1 suppository (25 mg total) rectally 2 (two) times daily as needed for hemorrhoids. Okay to substitute what is covered by insurance What changed:    when to take this  reasons to take this  additional instructions   levocetirizine 5 MG tablet Commonly known as:  XYZAL Take 5 mg by mouth daily.    levothyroxine 175 MCG tablet Commonly known as:  SYNTHROID, LEVOTHROID Take 175 mcg by mouth daily before breakfast. Mon - Fri   levothyroxine 200 MCG tablet Commonly known as:  SYNTHROID, LEVOTHROID Take 200 mcg by mouth daily before breakfast. Sat, Sun   multivitamin tablet Take 1 tablet by mouth daily.   XHANCE 93 MCG/ACT [facility] Generic drug:  Fluticasone Propionate Place 1 spray into both nostrils 2 (two) times daily.        DISCHARGE INSTRUCTIONS:    Follow-up PMD 6 days  follow-up Dr. Marius Ditch 1 week  If you experience worsening of your admission symptoms, develop shortness of breath, life threatening emergency, suicidal or homicidal thoughts you must seek medical attention immediately by calling 911 or calling your MD immediately  if symptoms less severe.  You Must read complete instructions/literature along with all the possible adverse reactions/side effects for all the Medicines you take and that have been prescribed to you. Take any new Medicines after you have completely understood and accept all the possible adverse reactions/side effects.   Please note  You were cared for by a hospitalist during your hospital stay. If you have any questions about your discharge medications or the care you received while you were in the hospital after you are discharged, you can call the unit and asked to speak with the hospitalist on call if the hospitalist that took care of you is not available. Once you  are discharged, your primary care physician will handle any further medical issues. Please note that NO REFILLS for any discharge medications will be authorized once you are discharged, as it is imperative that you return to your primary care physician (or establish a relationship with a primary care physician if you do not have one) for your aftercare needs so that they can reassess your need for medications and monitor your lab values.    Today   CHIEF COMPLAINT:   Chief Complaint   Patient presents with  . Rectal Bleeding    HISTORY OF PRESENT ILLNESS:  [name]  is a 32 y.o. male presented with rectal bleeding   VITAL SIGNS:  Blood pressure (!) 150/98, pulse 87, temperature 97.8 F (36.6 C), temperature source Oral, resp. rate 17, height 6\' 1"  (1.854 m), weight 83 kg (182 lb 14.4 oz), SpO2 100 %.    PHYSICAL EXAMINATION:  GENERAL:  31 y.o.-year-old patient lying in the bed with no acute distress.  EYES: Pupils equal, round, reactive to light and accommodation. No scleral icterus. Extraocular muscles intact.  HEENT: Head atraumatic, normocephalic. Oropharynx and nasopharynx clear.  NECK:  Supple, no jugular venous distention. No thyroid enlargement, no tenderness.  LUNGS: Normal breath sounds bilaterally, no wheezing, rales,rhonchi or crepitation. No use of accessory muscles of respiration.  CARDIOVASCULAR: S1, S2 normal. No murmurs, rubs, or gallops.  ABDOMEN: Soft, non-tender, non-distended. Bowel sounds present. No organomegaly or mass.  EXTREMITIES: No pedal edema, cyanosis, or clubbing.  NEUROLOGIC: Cranial nerves II through XII are intact. Muscle strength 5/5 in all extremities. Sensation intact. Gait not checked.  PSYCHIATRIC: The patient is alert and oriented x 3.  SKIN: No obvious rash, lesion, or ulcer.   DATA REVIEW:   CBC Recent Labs  Lab 04/10/18 0411  04/11/18 0436  WBC 4.5  --   --   HGB 13.9   < > 13.6  HCT 39.4*  --   --   PLT 168  --   --    < > = values in this interval not displayed.    Chemistries  Recent Labs  Lab 04/09/18 2010 04/10/18 0411  NA 139 140  K 3.8 4.0  CL 103 105  CO2 28 29  GLUCOSE 127* 103*  BUN 14 12  CREATININE 1.04 0.95  CALCIUM 9.3 8.7*  AST 21  --   ALT 28  --   ALKPHOS 77  --   BILITOT 0.7  --      Management plans discussed with the patient, and he is in agreement.  CODE STATUS:     Code Status Orders  (From admission, onward)        Start     Ordered   04/10/18 0203  Full  code  Continuous     04/10/18 0202    Code Status History    This patient has a current code status but no historical code status.      TOTAL TIME TAKING CARE OF THIS PATIENT: 31 minutes.    Loletha Grayer M.D on 04/11/2018 at 1:33 PM  Between 7am to 6pm - Pager - [phone]  After 6pm go to [email or web address] - password [email or web address]  [facility] Office  [phone]  CC: Primary care physician; Owens Loffler, MD

## 2018-04-13 ENCOUNTER — Telehealth: Payer: Self-pay | Admitting: Gastroenterology

## 2018-04-13 DIAGNOSIS — J301 Allergic rhinitis due to pollen: Secondary | ICD-10-CM | POA: Diagnosis not present

## 2018-04-13 NOTE — Telephone Encounter (Signed)
Spoke with pt to set up [facility] apt this week per note from Dr. Bonna Gains pt states he has apt 06/3 that he will keep instead

## 2018-04-19 ENCOUNTER — Ambulatory Visit: Payer: BLUE CROSS/BLUE SHIELD | Admitting: Gastroenterology

## 2018-04-19 DIAGNOSIS — J301 Allergic rhinitis due to pollen: Secondary | ICD-10-CM | POA: Diagnosis not present

## 2018-04-26 DIAGNOSIS — J301 Allergic rhinitis due to pollen: Secondary | ICD-10-CM | POA: Diagnosis not present

## 2018-05-03 ENCOUNTER — Other Ambulatory Visit: Payer: Self-pay | Admitting: Family Medicine

## 2018-05-03 ENCOUNTER — Telehealth: Payer: Self-pay

## 2018-05-03 ENCOUNTER — Ambulatory Visit: Payer: BLUE CROSS/BLUE SHIELD | Admitting: Gastroenterology

## 2018-05-03 DIAGNOSIS — K649 Unspecified hemorrhoids: Secondary | ICD-10-CM

## 2018-05-03 DIAGNOSIS — J301 Allergic rhinitis due to pollen: Secondary | ICD-10-CM | POA: Diagnosis not present

## 2018-05-03 DIAGNOSIS — K625 Hemorrhage of anus and rectum: Secondary | ICD-10-CM

## 2018-05-03 NOTE — Telephone Encounter (Signed)
Reasonable and done

## 2018-05-03 NOTE — Telephone Encounter (Signed)
Copied from [place] [id number]. Topic: Referral - Request >> May 03, 2018  1:09 PM Nils Flack wrote: Reason for CRM: pt did not have good experience at [facility] - he would like referral to [facility]. Cb is [phone]

## 2018-05-05 ENCOUNTER — Telehealth: Payer: Self-pay

## 2018-05-05 NOTE — Telephone Encounter (Signed)
Please see referral note.

## 2018-05-05 NOTE — Telephone Encounter (Signed)
Spoke with patient and advised patient that I did fax referral over yesterday 05/04/18 at 3 pm and received confirmation it went through. I am refaxing referral again and will follow up with [facility] office in a few minutes to make sure they received it.-Ibrahem Volkman V Herschell Virani, RMA

## 2018-05-05 NOTE — Telephone Encounter (Signed)
Copied from [place] [id number]. Topic: Referral - Status >> May 05, 2018 10:33 AM Mylinda Latina, NT wrote: Reason for CRM: Patient called and states he called  the office and wanted to be referred to [place] . He called them and they have not received the referral. Patient is checking the status. Patient called back # [phone].  [facility] fax # [phone].

## 2018-05-06 LAB — HIV ANTIBODY (ROUTINE TESTING W REFLEX): HIV SCREEN 4TH GENERATION: NONREACTIVE

## 2018-05-10 DIAGNOSIS — R198 Other specified symptoms and signs involving the digestive system and abdomen: Secondary | ICD-10-CM | POA: Diagnosis not present

## 2018-05-10 DIAGNOSIS — J301 Allergic rhinitis due to pollen: Secondary | ICD-10-CM | POA: Diagnosis not present

## 2018-05-10 DIAGNOSIS — K625 Hemorrhage of anus and rectum: Secondary | ICD-10-CM | POA: Diagnosis not present

## 2018-05-17 DIAGNOSIS — K640 First degree hemorrhoids: Secondary | ICD-10-CM | POA: Diagnosis not present

## 2018-05-17 DIAGNOSIS — K648 Other hemorrhoids: Secondary | ICD-10-CM | POA: Diagnosis not present

## 2018-05-17 DIAGNOSIS — K921 Melena: Secondary | ICD-10-CM | POA: Diagnosis not present

## 2018-05-18 DIAGNOSIS — J301 Allergic rhinitis due to pollen: Secondary | ICD-10-CM | POA: Diagnosis not present

## 2018-05-19 IMAGING — CT CT HEAD W/O CM
3 series · 15 of 47 positions shown, 18 images · non-contrast
Comparison: None.

CLINICAL DATA: Headache x 1 week, seizure like activity today

EXAM:
CT HEAD WITHOUT CONTRAST
TECHNIQUE: Contiguous axial images were obtained from the base of the skull
through the vertex without intravenous contrast.

[Series 2: head wo · axial · 0.47mm/px · z∈[-128,-3]mm · 9 of 30 slices shown, 12 images]
[im 3/30  brain]
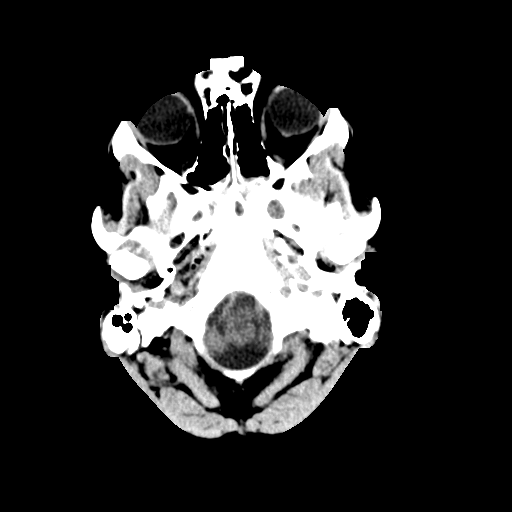
[im 3/30  bone]
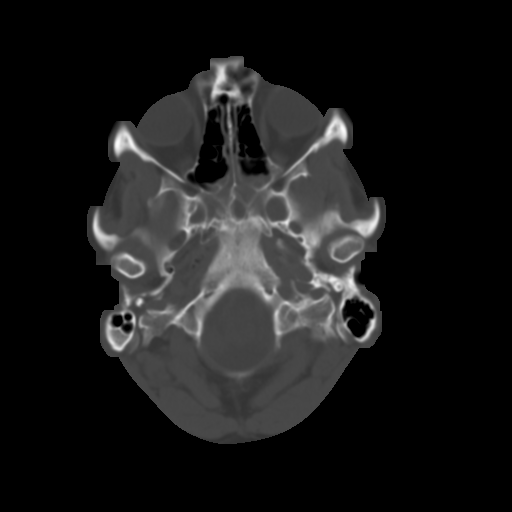
[im 6/30  brain]
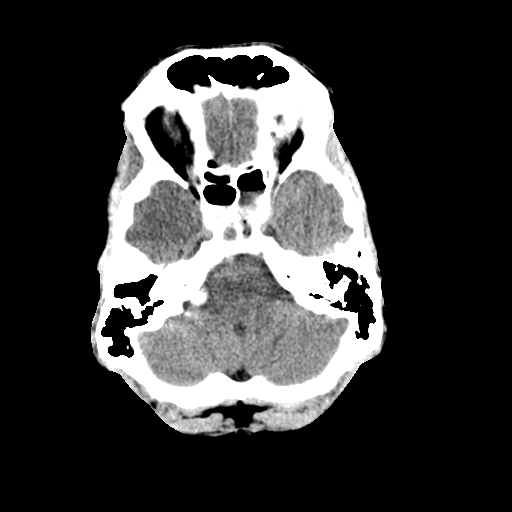
[im 9/30  brain]
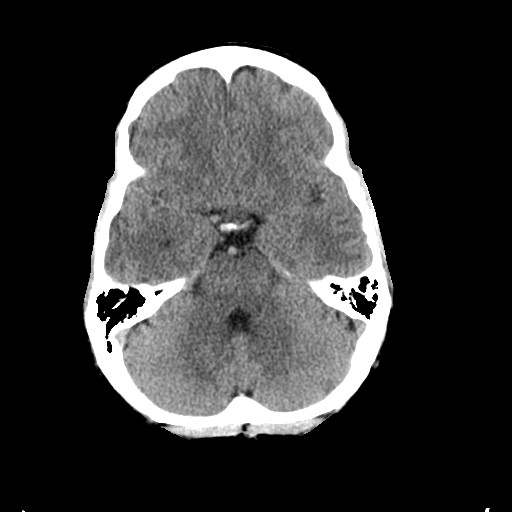
[im 12/30  brain]
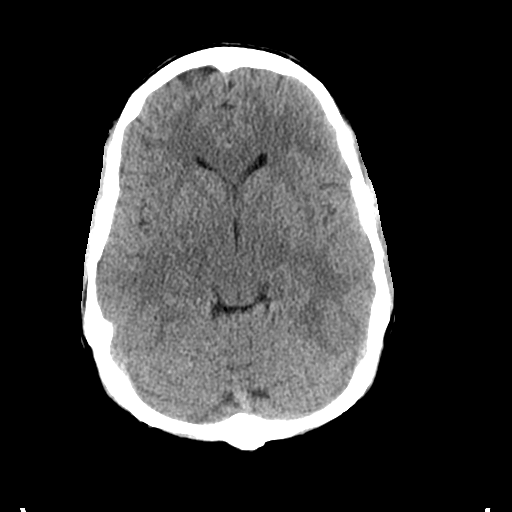
[im 16/30  brain]
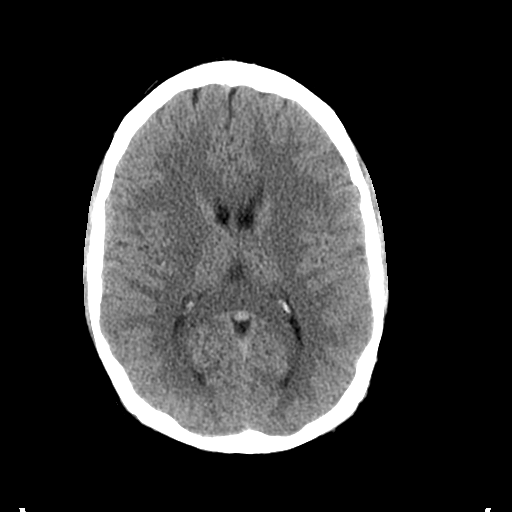
[im 16/30  bone]
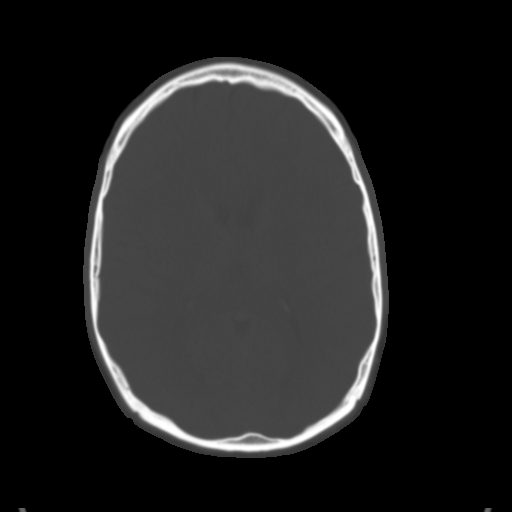
[im 19/30  brain]
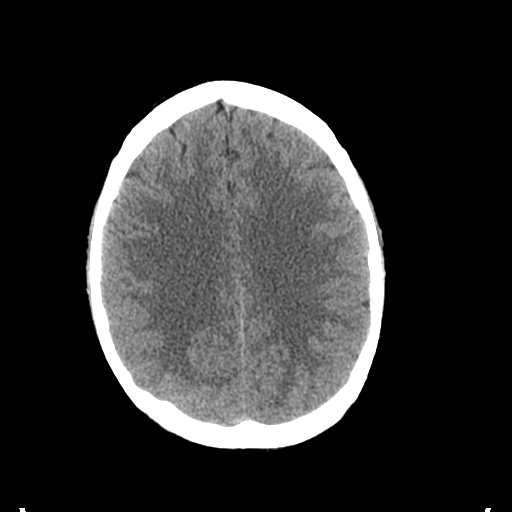
[im 22/30  brain]
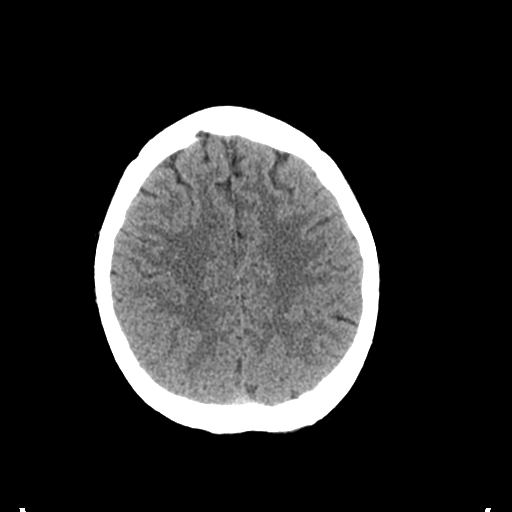
[im 25/30  brain]
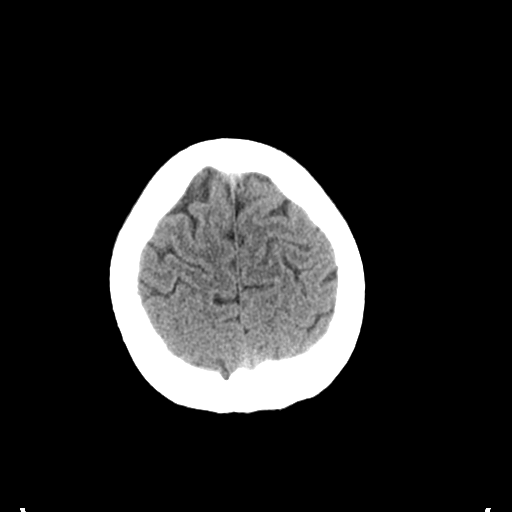
[im 28/30  brain]
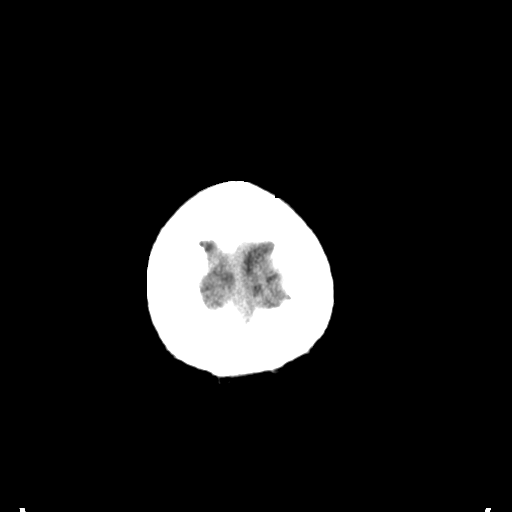
[im 28/30  bone]
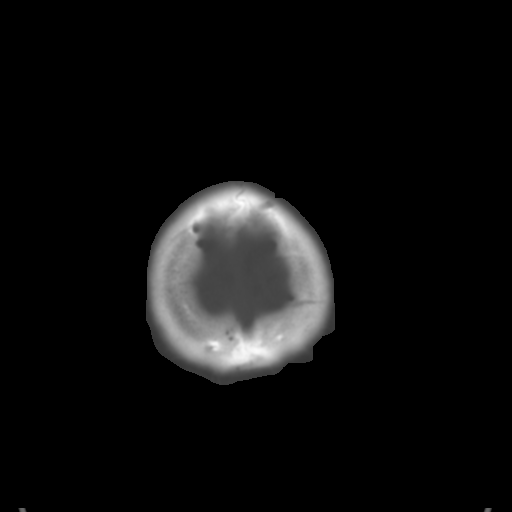

[Series 4: coronal soft tissue · coronal · 0.31mm/px · 3 of 65 slices shown]
[im 22/65  brain]
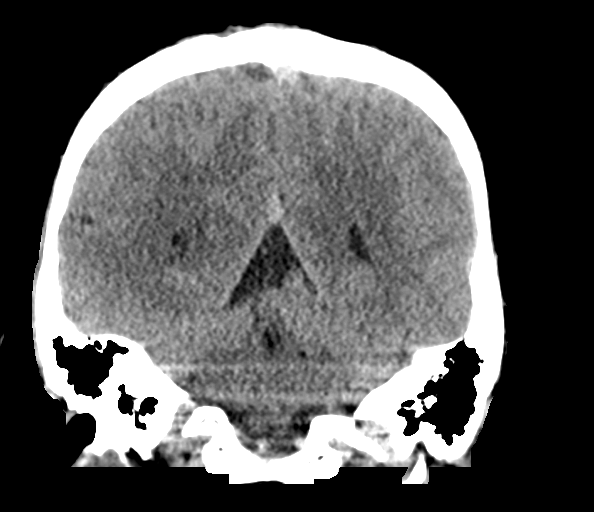
[im 29/65  brain]
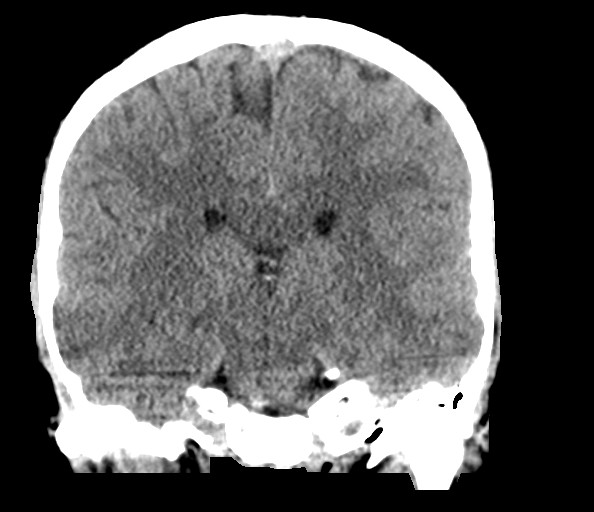
[im 36/65  brain]
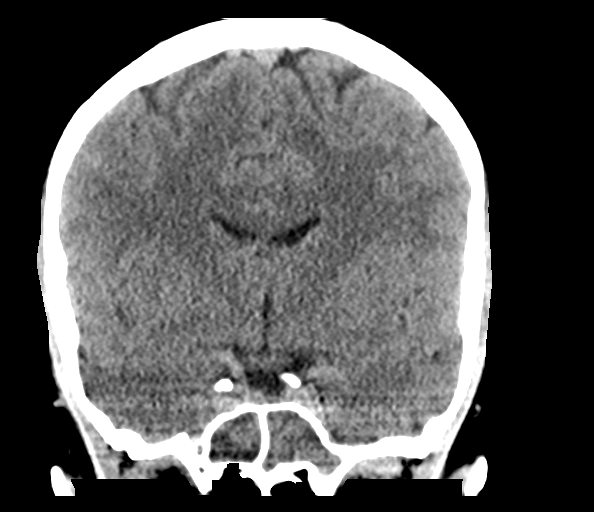

[Series 5: sagittal soft tissue · sagittal · 0.33mm/px · 3 of 52 slices shown]
[im 18/52  brain]
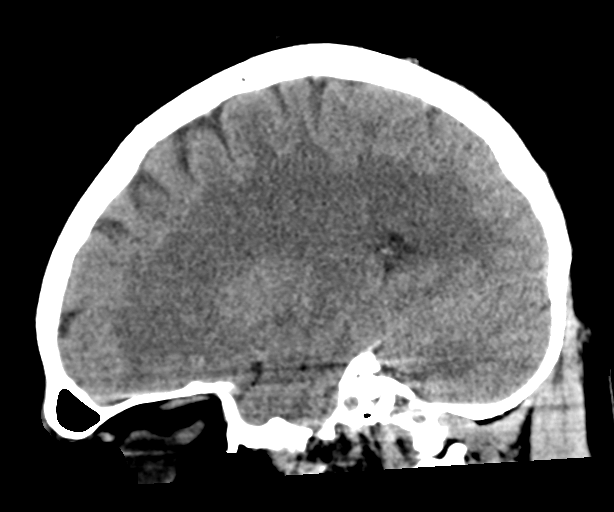
[im 26/52  brain]
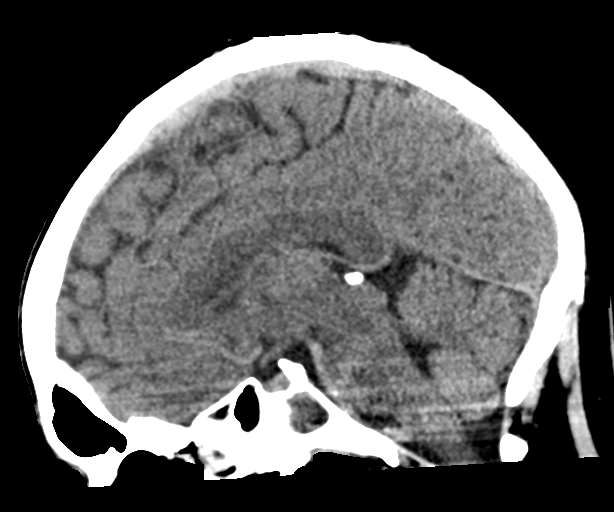
[im 35/52  brain]
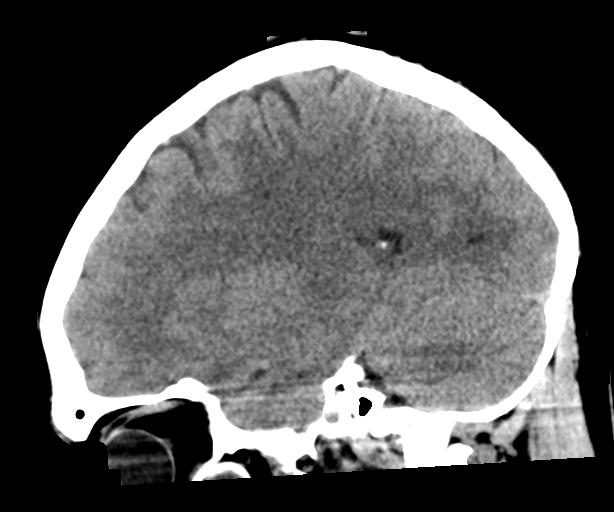

[15 of 47 positions shown; findings below may reference images not displayed]

FINDINGS: Brain: Ventricles are normal in size and configuration. All areas of
the brain demonstrate normal gray-white matter differentiation.
There is no mass, hemorrhage, edema or other evidence of acute
parenchymal abnormality. No extra-axial hemorrhage.

Vascular: No hyperdense vessel or unexpected calcification.

Skull: Normal. Negative for fracture or focal lesion.

Sinuses/Orbits: Fluid levels and/or mucosal thickening within the
bilateral sphenoid sinuses and upper left maxillary sinus,
incompletely imaged.

Visualized upper periorbital and retro-orbital soft tissues are
unremarkable.

Other: None.
IMPRESSION: 1. Bilateral sphenoid and left maxillary sinus disease, incompletely
imaged. Associated fluid levels suggest some component of acute
sinusitis.
2. No intracranial abnormality. No intracranial mass, hemorrhage or
edema.

## 2018-05-26 DIAGNOSIS — J301 Allergic rhinitis due to pollen: Secondary | ICD-10-CM | POA: Diagnosis not present

## 2018-05-31 DIAGNOSIS — R198 Other specified symptoms and signs involving the digestive system and abdomen: Secondary | ICD-10-CM | POA: Diagnosis not present

## 2018-05-31 DIAGNOSIS — J301 Allergic rhinitis due to pollen: Secondary | ICD-10-CM | POA: Diagnosis not present

## 2018-06-07 DIAGNOSIS — J301 Allergic rhinitis due to pollen: Secondary | ICD-10-CM | POA: Diagnosis not present

## 2018-06-14 DIAGNOSIS — J301 Allergic rhinitis due to pollen: Secondary | ICD-10-CM | POA: Diagnosis not present

## 2018-06-21 DIAGNOSIS — J301 Allergic rhinitis due to pollen: Secondary | ICD-10-CM | POA: Diagnosis not present

## 2018-06-28 DIAGNOSIS — J301 Allergic rhinitis due to pollen: Secondary | ICD-10-CM | POA: Diagnosis not present

## 2018-07-05 DIAGNOSIS — J301 Allergic rhinitis due to pollen: Secondary | ICD-10-CM | POA: Diagnosis not present

## 2018-07-12 DIAGNOSIS — J301 Allergic rhinitis due to pollen: Secondary | ICD-10-CM | POA: Diagnosis not present

## 2018-07-20 DIAGNOSIS — J301 Allergic rhinitis due to pollen: Secondary | ICD-10-CM | POA: Diagnosis not present

## 2018-07-22 DIAGNOSIS — E039 Hypothyroidism, unspecified: Secondary | ICD-10-CM | POA: Diagnosis not present

## 2018-07-26 DIAGNOSIS — J301 Allergic rhinitis due to pollen: Secondary | ICD-10-CM | POA: Diagnosis not present

## 2018-07-29 DIAGNOSIS — E039 Hypothyroidism, unspecified: Secondary | ICD-10-CM | POA: Diagnosis not present

## 2018-07-29 DIAGNOSIS — J301 Allergic rhinitis due to pollen: Secondary | ICD-10-CM | POA: Diagnosis not present

## 2018-08-03 ENCOUNTER — Ambulatory Visit: Payer: Self-pay | Admitting: Urology

## 2018-08-06 ENCOUNTER — Other Ambulatory Visit: Payer: Self-pay

## 2018-08-06 ENCOUNTER — Ambulatory Visit: Payer: BLUE CROSS/BLUE SHIELD | Admitting: Urology

## 2018-08-06 ENCOUNTER — Encounter: Payer: Self-pay | Admitting: Urology

## 2018-08-06 VITALS — BP 154/94 | HR 98 | Ht 73.0 in | Wt 186.6 lb

## 2018-08-06 DIAGNOSIS — N4342 Spermatocele of epididymis, multiple: Secondary | ICD-10-CM

## 2018-08-06 DIAGNOSIS — Z3009 Encounter for other general counseling and advice on contraception: Secondary | ICD-10-CM

## 2018-08-06 DIAGNOSIS — N411 Chronic prostatitis: Secondary | ICD-10-CM | POA: Diagnosis not present

## 2018-08-06 LAB — URINALYSIS, COMPLETE
Bilirubin, UA: NEGATIVE
Glucose, UA: NEGATIVE
KETONES UA: NEGATIVE
LEUKOCYTES UA: NEGATIVE
NITRITE UA: NEGATIVE
Protein, UA: NEGATIVE
Specific Gravity, UA: 1.02 (ref 1.005–1.030)
Urobilinogen, Ur: 0.2 mg/dL (ref 0.2–1.0)
pH, UA: 7.5 (ref 5.0–7.5)

## 2018-08-06 LAB — MICROSCOPIC EXAMINATION
Bacteria, UA: NONE SEEN
EPITHELIAL CELLS (NON RENAL): NONE SEEN /HPF (ref 0–10)
WBC, UA: NONE SEEN /hpf (ref 0–5)

## 2018-08-06 NOTE — Progress Notes (Signed)
08/06/2018 6:49 PM   Allen Long 1986/07/23 [id number]  Referring provider: Owens Loffler, MD [address] [place], [place] [address]  Chief Complaint  Patient presents with  . Establish Care    HPI: 32 year old male referred for multiple GU issues.  Several months ago, he developed rectal pain discomfort with pressure.  He underwent extensive work-up with a gastroenterologist for this.  He was ultimately diagnosed with internal hemorrhoids and underwent banding complicated by bleeding.  Ultimately, he suspicious that this was not the cause of his rectal pain.  Over the past several weeks, the pain has begun to subside but he has waves of discomfort lasting several weeks at a time.  No obvious aggravating or alleviating factors.  He denies any additional GI issues including no constipation or diarrhea.  Additionally, today he mentions that he is interested in vasectomy.  He is wife have biological children different desire no further biological pregnancies.  He also mentions today that he has a cyst which he believes is a spermatocele diagnosed many years ago.  He does have some fullness and discomfort at times.  He wonders if he is going to pursue vasectomy, if this should be excised at the same time.  Is remained stable in size.  Finally, he has a personal history of hypospadias which was repaired as a child.  He reports that in college, he had to have this revised with a small surgical procedure.  He said no issues with his urinary stream or difficulty emptying his bladder.  No dysuria or gross hematuria.  No urgency or frequency.  He does report that he rides a bicycle, frequently his palatine and does not always wear a padded pants.   PMH: Past Medical History:  Diagnosis Date  . Hypothyroid     Surgical History: Past Surgical History:  Procedure Laterality Date  . APPENDECTOMY    . FRONTAL SINUS EXPLORATION Bilateral 10/15/2017   Procedure: FRONTAL SINUS  EXPLORATION;  Surgeon: Margaretha Sheffield, MD;  Location: [place];  Service: ENT;  Laterality: Bilateral;  . HYPOSPADIAS CORRECTION     as infant  . IMAGE GUIDED SINUS SURGERY Bilateral 10/15/2017   Procedure: IMAGE GUIDED SINUS SURGERY;  Surgeon: Margaretha Sheffield, MD;  Location: [place];  Service: ENT;  Laterality: Bilateral;  . MASS EXCISION     scar tissue from prior surgery  . NASAL SEPTOPLASTY W/ TURBINOPLASTY Bilateral 10/15/2017   Procedure: NASAL SEPTOPLASTY WITH TURBINATE REDUCTION;  Surgeon: Margaretha Sheffield, MD;  Location: [place];  Service: ENT;  Laterality: Bilateral;  . SEPTOPLASTY WITH ETHMOIDECTOMY, AND MAXILLARY ANTROSTOMY Bilateral 10/15/2017   Procedure: SEPTOPLASTY WITH ETHMOIDECTOMY, AND MAXILLARY ANTROSTOMY;  Surgeon: Margaretha Sheffield, MD;  Location: [place];  Service: ENT;  Laterality: Bilateral;  . SPHENOIDECTOMY Bilateral 10/15/2017   Procedure: SPHENOIDECTOMY;  Surgeon: Margaretha Sheffield, MD;  Location: [place];  Service: ENT;  Laterality: Bilateral;    Home Medications:  Allergies as of 08/06/2018      Reactions   Penicillins    REACTION: Rash as a child (has had Augmentin and Keflex with NO reaction)      Medication List        Accurate as of 08/06/18 11:59 PM. Always use your most recent med list.          FISH OIL PO Take 1 capsule by mouth daily.   levocetirizine 5 MG tablet Commonly known as:  XYZAL Take 5 mg by mouth daily.  levothyroxine 175 MCG tablet Commonly known as:  SYNTHROID, LEVOTHROID Take 175 mcg by mouth daily before breakfast. Mon - Fri   levothyroxine 200 MCG tablet Commonly known as:  SYNTHROID, LEVOTHROID Take 200 mcg by mouth daily before breakfast. Sat, Sun   multivitamin tablet Take 1 tablet by mouth daily.   XHANCE 93 MCG/ACT Exhu Generic drug:  Fluticasone Propionate Place 1 spray into both nostrils 2 (two) times daily.       Allergies:  Allergies  Allergen Reactions   . Penicillins     REACTION: Rash as a child (has had Augmentin and Keflex with NO reaction)    Family History: Family History  Problem Relation Age of Onset  . Prostate cancer Neg Hx   . Bladder Cancer Neg Hx   . Kidney cancer Neg Hx     Social History:  reports that he has never smoked. He has never used smokeless tobacco. He reports that he drinks about 3.0 standard drinks of alcohol per week. He reports that he does not use drugs.  ROS: UROLOGY Frequent Urination?: No Hard to postpone urination?: No Burning/pain with urination?: No Get up at night to urinate?: Yes Leakage of urine?: No Urine stream starts and stops?: No Trouble starting stream?: No Do you have to strain to urinate?: No Blood in urine?: No Urinary tract infection?: No Sexually transmitted disease?: No Injury to kidneys or bladder?: No Painful intercourse?: No Weak stream?: Yes Erection problems?: No Penile pain?: No  Gastrointestinal Nausea?: No Vomiting?: No Indigestion/heartburn?: No Diarrhea?: No Constipation?: No  Constitutional Fever: No Night sweats?: No Weight loss?: No Fatigue?: No  Skin Skin rash/lesions?: No Itching?: No  Eyes Blurred vision?: No Double vision?: No  Ears/Nose/Throat Sore throat?: No Sinus problems?: Yes  Hematologic/Lymphatic Swollen glands?: No Easy bruising?: No  Cardiovascular Leg swelling?: No Chest pain?: No  Respiratory Cough?: No Shortness of breath?: No  Endocrine Excessive thirst?: No  Musculoskeletal Back pain?: No Joint pain?: No  Neurological Headaches?: No Dizziness?: No  Psychologic Depression?: No Anxiety?: No  Physical Exam: BP (!) 154/94   Pulse 98   Ht 6\' 1"  (1.854 m)   Wt 186 lb 9.6 oz (84.6 kg)   BMI 24.62 kg/m   Constitutional:  Alert and oriented, No acute distress. HEENT: [place] AT, moist mucus membranes.  Trachea midline, no masses. Cardiovascular: No clubbing, cyanosis, or edema. Respiratory: Normal  respiratory effort, no increased work of breathing. GI: Abdomen is soft, nontender, nondistended, no abdominal masses GU: Normal circumcised phallus with slight scar tissue on the ventral aspect of the glans consistent with hypospadias repair.  Widely patent urethral meatus.  Bilateral descended testicles.  Left vas easily palpable.  Normal testicles bilaterally.  Illness at the right epididymal tail, approximately 1.5 cm as well as a small spherical presumed cyst of the right epididymal head.  Right cord is more full, questionable cord lipoma but ultimately the vas is palpable on the side as well. Skin: No rashes, bruises or suspicious lesions.  Multiple diffuse skin lipomas appreciated. Neurologic: Grossly intact, no focal deficits, moving all 4 extremities. Psychiatric: Normal mood and affect.  Laboratory Data: Lab Results  Component Value Date   WBC 4.5 04/10/2018   HGB 13.6 04/11/2018   HCT 39.4 (L) 04/10/2018   MCV 84.9 04/10/2018   PLT 168 04/10/2018    Lab Results  Component Value Date   CREATININE 0.95 04/10/2018    Urinalysis Results for orders placed or performed in visit on 08/06/18  Microscopic  Examination  Result Value Ref Range   WBC, UA None seen 0 - 5 /hpf   RBC, UA 0-2 0 - 2 /hpf   Epithelial Cells (non renal) None seen 0 - 10 /hpf   Bacteria, UA None seen None seen/Few  Urinalysis, Complete  Result Value Ref Range   Specific Gravity, UA 1.020 1.005 - 1.030   pH, UA 7.5 5.0 - 7.5   Color, UA Yellow Yellow   Appearance Ur Clear Clear   Leukocytes, UA Negative Negative   Protein, UA Negative Negative/Trace   Glucose, UA Negative Negative   Ketones, UA Negative Negative   RBC, UA Trace (A) Negative   Bilirubin, UA Negative Negative   Urobilinogen, Ur 0.2 0.2 - 1.0 mg/dL   Nitrite, UA Negative Negative   Microscopic Examination See below:     Pertinent Imaging: n/a  Assessment & Plan:    1. Chronic prostatitis Intermittent dull aching rectal  discomfort without associated urinary symptoms possibly exacerbated by bicycle riding We discussed today that this is consistent with possible inflammatory prostatitis Recommend supportive care, avoidance of aggravating behaviors at times of irritation, and NSAIDs Rectal exam deferred today as he is currently asymptomatic - Urinalysis, Complete  2. Spermatocele of epididymis, multiple Fullness both in the head and tail of right epididymis, presumably related to multiple spermatoceles For surgical planning purposes, I recommended a scrotal ultrasound to which she is agreeable He is mildly symptomatic but may consider spermatocelectomy versus epididymectomy at the time of mastectomy We discussed that this procedure would be more extensive required general anesthesia, will discuss further once scrotal ultrasound is complete - US SCROTUM; Future - US PELVIC DOPPLER (TORSION R/O OR MASS ARTERIAL FLOW); Future  3. Vasectomy evaluation Today, we discussed what the vas deferens is, where it is located, and its function. We reviewed the procedure for vasectomy, it's risks, benefits, alternatives, and likelihood of achieving his goals. We discussed in detail the procedure, complications, and recovery as well as the need for clearance prior to unprotected intercourse. We discussed that vasectomy does not protect against sexually transmitted diseases. We discussed that this procedure does not result in immediate sterility and that they would need to use other forms of birth control until he has been cleared with negative postvasectomy semen analyses. I explained that the procedure is considered to be permanent and that attempts at reversal have varying degrees of success. These options include vasectomy reversal, sperm retrieval, and in vitro fertilization; these can be very expensive. We discussed the chance of postvasectomy pain syndrome which occurs in less than 5% of patients. I explained to the patient that  there is no treatment to resolve this chronic pain, and that if it developed I would not be able to help resolve the issue, but that surgery is generally not needed for correction. I explained there have even been reports of systemic like illness associated with this chronic pain, and that there was no good cure. I explained that vasectomy it is not a 100% reliable form of birth control, and the risk of pregnancy after vasectomy is approximately 1 in 2000 men who had a negative postvasectomy semen analysis or rare non-motile sperm. I explained that repeat vasectomy was necessary in less than 1% of vasectomy procedures when employing the type of technique that I use. I explained that he should refrain from ejaculation for approximately one week following vasectomy. I explained that there are other options for birth control which are permanent and non-permanent; we discussed these.  I explained the rates of surgical complications, such as symptomatic hematoma or infection, are low (1-2%) and vary with the surgeon's experience and criteria used to diagnose the complication.    Return in about 4 weeks (around 09/03/2018) for f/u scrotal ultrasound.  Hollice Espy, MD  [facility] [address] [place], [place] [address] [phone]  I spent 45 min with this patient of which greater than 50% was spent in counseling and coordination of care with the patient.

## 2018-08-09 DIAGNOSIS — J301 Allergic rhinitis due to pollen: Secondary | ICD-10-CM | POA: Diagnosis not present

## 2018-08-16 ENCOUNTER — Ambulatory Visit
Admission: RE | Admit: 2018-08-16 | Discharge: 2018-08-16 | Disposition: A | Discharge status: Home / Self Care | Payer: BLUE CROSS/BLUE SHIELD | Source: Ambulatory Visit | Attending: Urology | Admitting: Urology

## 2018-08-16 DIAGNOSIS — N4342 Spermatocele of epididymis, multiple: Secondary | ICD-10-CM | POA: Insufficient documentation

## 2018-08-16 DIAGNOSIS — N433 Hydrocele, unspecified: Secondary | ICD-10-CM | POA: Diagnosis not present

## 2018-08-16 DIAGNOSIS — J301 Allergic rhinitis due to pollen: Secondary | ICD-10-CM | POA: Diagnosis not present

## 2018-08-18 DIAGNOSIS — J301 Allergic rhinitis due to pollen: Secondary | ICD-10-CM | POA: Diagnosis not present

## 2018-08-23 DIAGNOSIS — J301 Allergic rhinitis due to pollen: Secondary | ICD-10-CM | POA: Diagnosis not present

## 2018-08-24 ENCOUNTER — Telehealth: Payer: Self-pay

## 2018-08-24 NOTE — Telephone Encounter (Signed)
Pt received approval for Atomoxetine 80mg  (Strattera) [phone] thru 08/24/2021

## 2018-08-27 ENCOUNTER — Ambulatory Visit (INDEPENDENT_AMBULATORY_CARE_PROVIDER_SITE_OTHER): Payer: BLUE CROSS/BLUE SHIELD | Admitting: Urology

## 2018-08-27 ENCOUNTER — Encounter: Payer: Self-pay | Admitting: Urology

## 2018-08-27 ENCOUNTER — Other Ambulatory Visit: Payer: Self-pay

## 2018-08-27 VITALS — BP 120/68 | HR 66 | Ht 73.0 in | Wt 185.0 lb

## 2018-08-27 DIAGNOSIS — N4341 Spermatocele of epididymis, single: Secondary | ICD-10-CM

## 2018-08-27 DIAGNOSIS — Z3009 Encounter for other general counseling and advice on contraception: Secondary | ICD-10-CM | POA: Diagnosis not present

## 2018-08-28 NOTE — Progress Notes (Signed)
08/27/2018 8:52 AM   Allen Long [birth year] [id number]  Referring provider: Owens Loffler, MD [address] [place], [place] [address]  Chief Complaint  Patient presents with  . Follow-up    HPI: 32 year old male who presents today with follow-up of his scrotal ultrasound.  Please see previous note for details.    He has had an enlarged right hemiscrotum which is occasionally symptomatic.  He is also considering vasectomy.  Scrotal ultrasound shows a 1.5 cm simple right extratesticular cyst inferior to the right testicle consistent with spermatocele.  The ultrasound was otherwise unremarkable with normal bilateral testicles.   PMH: Past Medical History:  Diagnosis Date  . Hypothyroid     Surgical History: Past Surgical History:  Procedure Laterality Date  . APPENDECTOMY    . FRONTAL SINUS EXPLORATION Bilateral 10/15/2017   Procedure: FRONTAL SINUS EXPLORATION;  Surgeon: Margaretha Sheffield, MD;  Location: [place];  Service: ENT;  Laterality: Bilateral;  . HYPOSPADIAS CORRECTION     as infant  . IMAGE GUIDED SINUS SURGERY Bilateral 10/15/2017   Procedure: IMAGE GUIDED SINUS SURGERY;  Surgeon: Margaretha Sheffield, MD;  Location: [place];  Service: ENT;  Laterality: Bilateral;  . MASS EXCISION     scar tissue from prior surgery  . NASAL SEPTOPLASTY W/ TURBINOPLASTY Bilateral 10/15/2017   Procedure: NASAL SEPTOPLASTY WITH TURBINATE REDUCTION;  Surgeon: Margaretha Sheffield, MD;  Location: [place];  Service: ENT;  Laterality: Bilateral;  . SEPTOPLASTY WITH ETHMOIDECTOMY, AND MAXILLARY ANTROSTOMY Bilateral 10/15/2017   Procedure: SEPTOPLASTY WITH ETHMOIDECTOMY, AND MAXILLARY ANTROSTOMY;  Surgeon: Margaretha Sheffield, MD;  Location: [place];  Service: ENT;  Laterality: Bilateral;  . SPHENOIDECTOMY Bilateral 10/15/2017   Procedure: SPHENOIDECTOMY;  Surgeon: Margaretha Sheffield, MD;  Location: [place];  Service: ENT;  Laterality: Bilateral;     Home Medications:  Allergies as of 08/27/2018      Reactions   Penicillins    REACTION: Rash as a child (has had Augmentin and Keflex with NO reaction)      Medication List        Accurate as of 08/27/18 11:59 PM. Always use your most recent med list.          FISH OIL PO Take 1 capsule by mouth daily.   levocetirizine 5 MG tablet Commonly known as:  XYZAL Take 5 mg by mouth daily.   levothyroxine 175 MCG tablet Commonly known as:  SYNTHROID, LEVOTHROID Take 175 mcg by mouth daily before breakfast. Mon - Fri   levothyroxine 200 MCG tablet Commonly known as:  SYNTHROID, LEVOTHROID Take 200 mcg by mouth daily before breakfast. Sat, Sun   multivitamin tablet Take 1 tablet by mouth daily.   XHANCE 93 MCG/ACT Exhu Generic drug:  Fluticasone Propionate Place 1 spray into both nostrils 2 (two) times daily.       Allergies:  Allergies  Allergen Reactions  . Penicillins     REACTION: Rash as a child (has had Augmentin and Keflex with NO reaction)    Family History: Family History  Problem Relation Age of Onset  . Prostate cancer Neg Hx   . Bladder Cancer Neg Hx   . Kidney cancer Neg Hx     Social History:  reports that he has never smoked. He has never used smokeless tobacco. He reports that he drinks about 3.0 standard drinks of alcohol per week. He reports that he does not use drugs.  ROS: UROLOGY Frequent Urination?: No Hard to postpone urination?:  No Burning/pain with urination?: No Get up at night to urinate?: No Leakage of urine?: No Urine stream starts and stops?: No Trouble starting stream?: No Do you have to strain to urinate?: No Blood in urine?: No Urinary tract infection?: No Sexually transmitted disease?: No Injury to kidneys or bladder?: No Painful intercourse?: No Weak stream?: No Erection problems?: No Penile pain?: No  Gastrointestinal Nausea?: No Vomiting?: No Indigestion/heartburn?: No Diarrhea?: No Constipation?:  No  Constitutional Fever: No Night sweats?: No Weight loss?: No Fatigue?: No  Skin Skin rash/lesions?: No Itching?: No  Eyes Blurred vision?: No Double vision?: No  Ears/Nose/Throat Sore throat?: No Sinus problems?: No  Hematologic/Lymphatic Swollen glands?: No Easy bruising?: No  Cardiovascular Leg swelling?: No Chest pain?: No  Respiratory Cough?: No Shortness of breath?: No  Endocrine Excessive thirst?: No  Musculoskeletal Back pain?: No Joint pain?: No  Neurological Headaches?: No Dizziness?: No  Psychologic Depression?: No Anxiety?: No  Physical Exam: BP 120/68   Pulse 66   Ht 6\' 1"  (1.854 m)   Wt 185 lb (83.9 kg)   BMI 24.41 kg/m   Constitutional:  Alert and oriented, No acute distress. HEENT: [place] AT, moist mucus membranes.  Trachea midline, no masses. Cardiovascular: No clubbing, cyanosis, or edema. Respiratory: Normal respiratory effort, no increased work of breathing. Skin: No rashes, bruises or suspicious lesions. Neurologic: Grossly intact, no focal deficits, moving all 4 extremities. Psychiatric: Normal mood and affect.  Laboratory Data: Lab Results  Component Value Date   WBC 4.5 04/10/2018   HGB 13.6 04/11/2018   HCT 39.4 (L) 04/10/2018   MCV 84.9 04/10/2018   PLT 168 04/10/2018    Lab Results  Component Value Date   CREATININE 0.95 04/10/2018    Urinalysis    Component Value Date/Time   APPEARANCEUR Clear 08/06/2018 0823   GLUCOSEU Negative 08/06/2018 0823   BILIRUBINUR Negative 08/06/2018 0823   PROTEINUR Negative 08/06/2018 0823   NITRITE Negative 08/06/2018 0823   LEUKOCYTESUR Negative 08/06/2018 0823    Lab Results  Component Value Date   LABMICR See below: 08/06/2018   WBCUA None seen 08/06/2018   RBCUA 0-2 08/06/2018   LABEPIT None seen 08/06/2018   BACTERIA None seen 08/06/2018    Pertinent Imaging: CLINICAL DATA:  Initial evaluation for spermatocele.  EXAM: SCROTAL ULTRASOUND  DOPPLER  ULTRASOUND OF THE TESTICLES  TECHNIQUE: Complete ultrasound examination of the testicles, epididymis, and other scrotal structures was performed. Color and spectral Doppler ultrasound were also utilized to evaluate blood flow to the testicles.  COMPARISON:  None.  FINDINGS: Right testicle  Measurements: 5.4 x 2.5 x 3.2 cm. No mass or microlithiasis visualized. 1.5 x 1.2 x 1.2 cm simple cyst at the inferior aspect of the right testicle, most consistent with a spermatocele.  Left testicle  Measurements: 5.4 x 2.4 x 3.3 cm. No mass or microlithiasis visualized.  Right epididymis:  Normal in size and appearance.  Left epididymis:  Normal in size and appearance.  Hydrocele:  Trace bilateral hydroceles, of doubtful significance.  Varicocele:  None visualized.  Pulsed Doppler interrogation of both testes demonstrates normal low resistance arterial and venous waveforms bilaterally.  IMPRESSION: 1. 1.5 cm simple right extratesticular cyst, just inferior to the right testicle, most consistent with a spermatocele. 2. Otherwise unremarkable scrotal ultrasound.   Electronically Signed   By: Jeannine Boga M.D.   On: 08/16/2018 17:43  Scrotal ultrasound personally reviewed today.  Assessment & Plan:    1. Spermatocele of epididymis, single Mildly symptomatic  right spermatocele Patient would like to have vasectomy and would prefer to have spermatocele excised concomitantly which is reasonable I have suggested this be done in the operating room under anesthesia complexity He is agreeable this plan We reviewed the risks including risk of bleeding, infection, recurrence of a spermatocele, hematoma, chronic pain amongst others.  All questions answered.  2. Vasectomy evaluation Risk and benefits previously discussed, see previous note for details.  All additional questions answered today.  Schedule vasectomy/right spermatocelecomy.Hollice Espy,  MD  [facility] [address] [place], [place] [address] [phone]

## 2018-08-30 DIAGNOSIS — J301 Allergic rhinitis due to pollen: Secondary | ICD-10-CM | POA: Diagnosis not present

## 2018-09-01 ENCOUNTER — Telehealth: Payer: Self-pay | Admitting: Urology

## 2018-09-01 NOTE — Telephone Encounter (Signed)
Pt called lmom stating that at his appt he was told that someone would call him to schedule his surgery. Pt is following up to schedule surgery, says he hasn't be contacted. Please advise. Thanks.

## 2018-09-02 ENCOUNTER — Other Ambulatory Visit: Payer: Self-pay | Admitting: Radiology

## 2018-09-02 ENCOUNTER — Telehealth: Payer: Self-pay | Admitting: Radiology

## 2018-09-02 DIAGNOSIS — Z302 Encounter for sterilization: Secondary | ICD-10-CM

## 2018-09-02 DIAGNOSIS — N4341 Spermatocele of epididymis, single: Secondary | ICD-10-CM

## 2018-09-02 NOTE — Telephone Encounter (Signed)
-----   Message from Hollice Espy, MD sent at 08/28/2018  9:02 AM EDT ----- Please book right spermatocelectomy and bilateral vasectomy in the OR.  This would be a perfect case to start in [place] on Friday AM or small break at end of the morning.  No other orderer other than clindamycin and SCDs.

## 2018-09-02 NOTE — Telephone Encounter (Signed)
Case has been scheduled at [facility] on 10/01/2018. Patient aware. Instructions given, questions answered, patient voices understanding.

## 2018-09-02 NOTE — Telephone Encounter (Signed)
See telephone note from 09/02/2018.

## 2018-09-06 DIAGNOSIS — J301 Allergic rhinitis due to pollen: Secondary | ICD-10-CM | POA: Diagnosis not present

## 2018-09-11 DIAGNOSIS — J0100 Acute maxillary sinusitis, unspecified: Secondary | ICD-10-CM | POA: Diagnosis not present

## 2018-09-16 DIAGNOSIS — J019 Acute sinusitis, unspecified: Secondary | ICD-10-CM | POA: Diagnosis not present

## 2018-09-16 DIAGNOSIS — R51 Headache: Secondary | ICD-10-CM | POA: Diagnosis not present

## 2018-09-16 DIAGNOSIS — J331 Polypoid sinus degeneration: Secondary | ICD-10-CM | POA: Diagnosis not present

## 2018-09-20 DIAGNOSIS — J301 Allergic rhinitis due to pollen: Secondary | ICD-10-CM | POA: Diagnosis not present

## 2018-09-23 ENCOUNTER — Encounter: Payer: Self-pay | Admitting: *Deleted

## 2018-09-23 ENCOUNTER — Other Ambulatory Visit: Payer: Self-pay

## 2018-09-27 DIAGNOSIS — J301 Allergic rhinitis due to pollen: Secondary | ICD-10-CM | POA: Diagnosis not present

## 2018-10-01 ENCOUNTER — Ambulatory Visit
Admission: RE | Admit: 2018-10-01 | Discharge: 2018-10-01 | Disposition: A | Discharge status: Home / Self Care | Payer: BLUE CROSS/BLUE SHIELD | Source: Ambulatory Visit | Attending: Urology | Admitting: Urology

## 2018-10-01 ENCOUNTER — Encounter
Admission: RE | Disposition: A | Discharge status: Home / Self Care | Payer: Self-pay | Source: Ambulatory Visit | Attending: Urology

## 2018-10-01 ENCOUNTER — Ambulatory Visit: Payer: BLUE CROSS/BLUE SHIELD | Admitting: Anesthesiology

## 2018-10-01 DIAGNOSIS — N5089 Other specified disorders of the male genital organs: Secondary | ICD-10-CM | POA: Diagnosis present

## 2018-10-01 DIAGNOSIS — E039 Hypothyroidism, unspecified: Secondary | ICD-10-CM | POA: Insufficient documentation

## 2018-10-01 DIAGNOSIS — Z7951 Long term (current) use of inhaled steroids: Secondary | ICD-10-CM | POA: Diagnosis not present

## 2018-10-01 DIAGNOSIS — N4340 Spermatocele of epididymis, unspecified: Secondary | ICD-10-CM | POA: Insufficient documentation

## 2018-10-01 DIAGNOSIS — N4341 Spermatocele of epididymis, single: Secondary | ICD-10-CM | POA: Diagnosis not present

## 2018-10-01 DIAGNOSIS — Z302 Encounter for sterilization: Secondary | ICD-10-CM | POA: Diagnosis not present

## 2018-10-01 DIAGNOSIS — Z79899 Other long term (current) drug therapy: Secondary | ICD-10-CM | POA: Insufficient documentation

## 2018-10-01 HISTORY — PX: VASECTOMY: SHX75

## 2018-10-01 HISTORY — PX: SPERMATOCELECTOMY: SHX2420

## 2018-10-01 SURGERY — EXCISION, SPERMATOCELE
Anesthesia: General | Site: Scrotum | Laterality: Right

## 2018-10-01 MED ORDER — OXYCODONE HCL 5 MG/5ML PO SOLN: 5.0000 mg | Freq: Once | ORAL | Status: AC | PRN

## 2018-10-01 MED ORDER — ACETAMINOPHEN 325 MG PO TABS: 325.0000 mg | ORAL_TABLET | ORAL | Status: DC | PRN

## 2018-10-01 MED ORDER — PROPOFOL 10 MG/ML IV BOLUS
INTRAVENOUS | Status: DC | PRN
  Administered 2018-10-01: 200 mg via INTRAVENOUS

## 2018-10-01 MED ORDER — MIDAZOLAM HCL 5 MG/5ML IJ SOLN
INTRAMUSCULAR | Status: DC | PRN
  Administered 2018-10-01: 2 mg via INTRAVENOUS

## 2018-10-01 MED ORDER — DOCUSATE SODIUM 100 MG PO CAPS: 100.0000 mg | ORAL_CAPSULE | Freq: Two times a day (BID) | ORAL | 0 refills | Status: DC

## 2018-10-01 MED ORDER — LIDOCAINE HCL (CARDIAC) PF 100 MG/5ML IV SOSY
PREFILLED_SYRINGE | INTRAVENOUS | Status: DC | PRN
  Administered 2018-10-01: 40 mg via INTRATRACHEAL

## 2018-10-01 MED ORDER — ONDANSETRON HCL 4 MG/2ML IJ SOLN
INTRAMUSCULAR | Status: DC | PRN
  Administered 2018-10-01: 4 mg via INTRAVENOUS

## 2018-10-01 MED ORDER — CLINDAMYCIN PHOSPHATE 900 MG/50ML IV SOLN
900.0000 mg | INTRAVENOUS | Status: AC
  Administered 2018-10-01: 900 mg via INTRAVENOUS

## 2018-10-01 MED ORDER — PROMETHAZINE HCL 25 MG/ML IJ SOLN: 6.2500 mg | INTRAMUSCULAR | Status: DC | PRN

## 2018-10-01 MED ORDER — ACETAMINOPHEN 10 MG/ML IV SOLN
1000.0000 mg | Freq: Once | INTRAVENOUS | Status: AC
  Administered 2018-10-01: 1000 mg via INTRAVENOUS

## 2018-10-01 MED ORDER — ACETAMINOPHEN 160 MG/5ML PO SOLN: 325.0000 mg | ORAL | Status: DC | PRN

## 2018-10-01 MED ORDER — FENTANYL CITRATE (PF) 100 MCG/2ML IJ SOLN
INTRAMUSCULAR | Status: DC | PRN
  Administered 2018-10-01: 100 ug via INTRAVENOUS

## 2018-10-01 MED ORDER — LACTATED RINGERS IV SOLN
10.0000 mL/h | INTRAVENOUS | Status: DC
  Administered 2018-10-01 (×2): 10 mL/h via INTRAVENOUS

## 2018-10-01 MED ORDER — OXYCODONE HCL 5 MG PO TABS
5.0000 mg | ORAL_TABLET | Freq: Once | ORAL | Status: AC | PRN
  Administered 2018-10-01: 5 mg via ORAL

## 2018-10-01 MED ORDER — GLYCOPYRROLATE 0.2 MG/ML IJ SOLN
INTRAMUSCULAR | Status: DC | PRN
  Administered 2018-10-01: 0.1 mg via INTRAVENOUS

## 2018-10-01 MED ORDER — DEXAMETHASONE SODIUM PHOSPHATE 4 MG/ML IJ SOLN
INTRAMUSCULAR | Status: DC | PRN
  Administered 2018-10-01: 4 mg via INTRAVENOUS

## 2018-10-01 MED ORDER — FENTANYL CITRATE (PF) 100 MCG/2ML IJ SOLN
25.0000 ug | INTRAMUSCULAR | Status: DC | PRN
  Administered 2018-10-01 (×2): 25 ug via INTRAVENOUS

## 2018-10-01 MED ORDER — HYDROCODONE-ACETAMINOPHEN 5-325 MG PO TABS: 1.0000 | ORAL_TABLET | Freq: Four times a day (QID) | ORAL | 0 refills | Status: DC | PRN

## 2018-10-01 MED ORDER — LIDOCAINE HCL 1 % IJ SOLN
INTRAMUSCULAR | Status: DC | PRN
  Administered 2018-10-01: 9 mL via INTRAMUSCULAR

## 2018-10-01 SURGICAL SUPPLY — 36 items
BLADE CLIPPER SURG (BLADE) ×3 IMPLANT
BLADE SURG 15 STRL LF DISP TIS (BLADE) ×2 IMPLANT
BLADE SURG 15 STRL SS (BLADE) ×1
BRIEF STRETCH MATERNITY 2XLG (MISCELLANEOUS) ×3 IMPLANT
CANISTER SUCT 1200ML W/VALVE (MISCELLANEOUS) ×3 IMPLANT
CHLORAPREP W/TINT 26ML (MISCELLANEOUS) ×3 IMPLANT
DERMABOND ADVANCED (GAUZE/BANDAGES/DRESSINGS) ×1
DERMABOND ADVANCED .7 DNX12 (GAUZE/BANDAGES/DRESSINGS) ×2 IMPLANT
DRAIN PENROSE 5/8X18 LTX STRL (WOUND CARE) ×3 IMPLANT
DRAPE LAPAROTOMY 77X122 PED (DRAPES) ×3 IMPLANT
DRSG GAUZE FLUFF 36X18 (GAUZE/BANDAGES/DRESSINGS) ×3 IMPLANT
DRSG TELFA 4X3 1S NADH ST (GAUZE/BANDAGES/DRESSINGS) ×3 IMPLANT
ELECT CAUTERY NEEDLE TIP 1.0 (MISCELLANEOUS) ×3
ELECT REM PT RETURN 9FT ADLT (ELECTROSURGICAL) ×3
ELECTRODE CAUTERY NEDL TIP 1.0 (MISCELLANEOUS) ×2 IMPLANT
ELECTRODE REM PT RTRN 9FT ADLT (ELECTROSURGICAL) ×2 IMPLANT
GAUZE SPONGE 4X4 12PLY STRL (GAUZE/BANDAGES/DRESSINGS) ×3 IMPLANT
GLOVE BIO SURGEON STRL SZ 6.5 (GLOVE) ×6 IMPLANT
GOWN STRL REUS W/ TWL LRG LVL3 (GOWN DISPOSABLE) ×4 IMPLANT
GOWN STRL REUS W/TWL LRG LVL3 (GOWN DISPOSABLE) ×2
KIT TURNOVER KIT A (KITS) ×3 IMPLANT
LABEL OR SOLS (LABEL) ×3 IMPLANT
NEEDLE HYPO 25X1 1.5 SAFETY (NEEDLE) ×3 IMPLANT
NS IRRIG 500ML POUR BTL (IV SOLUTION) ×3 IMPLANT
PACK BASIN MINOR ARMC (MISCELLANEOUS) ×3 IMPLANT
PENCIL SMOKE EVACUATOR (MISCELLANEOUS) IMPLANT
SUCTION FRAZIER HANDLE 10FR (MISCELLANEOUS) ×1
SUCTION TUBE FRAZIER 10FR DISP (MISCELLANEOUS) ×2 IMPLANT
SUPPORETR ATHLETIC LG (MISCELLANEOUS) IMPLANT
SUPPORT SCROTAL LG STRP (MISCELLANEOUS) IMPLANT
SUPPORTER ATHLETIC LG (MISCELLANEOUS)
SUT CHROMIC 3 0 SH 27 (SUTURE) ×3 IMPLANT
SUT CHROMIC 4 0 PS 2 18 (SUTURE) ×3 IMPLANT
SUT VIC AB 3-0 SH 27 (SUTURE) ×1
SUT VIC AB 3-0 SH 27X BRD (SUTURE) ×2 IMPLANT
SYR 10ML LL (SYRINGE) ×3 IMPLANT

## 2018-10-01 NOTE — Anesthesia Procedure Notes (Signed)
Procedure Name: LMA Insertion Date/Time: 10/01/2018 7:38 AM Performed by: Mayme Genta, CRNA Pre-anesthesia Checklist: Patient identified, Emergency Drugs available, Suction available, Timeout performed and Patient being monitored Patient Re-evaluated:Patient Re-evaluated prior to induction Oxygen Delivery Method: Circle system utilized Preoxygenation: Pre-oxygenation with 100% oxygen Induction Type: IV induction LMA: LMA inserted LMA Size: 4.0 Number of attempts: 1 Placement Confirmation: positive ETCO2 and breath sounds checked- equal and bilateral Tube secured with: Tape

## 2018-10-01 NOTE — Discharge Instructions (Signed)
Spermatocele A spermatocele is a fluid-filled sac (cyst) inside the scrotum. This type of cyst often forms at the top of the testicle where sperm is stored (epididymis). The cyst sometimes forms along the tube that carries sperm away from the epididymis (vas deferens). Spermatoceles are usually painless. Most cysts are small, but they can grow larger. Spermatoceles are not cancerous (are benign). What are the causes? The cause of this condition is not known. What are the signs or symptoms? In most cases, small cysts do not cause symptoms. If you do have symptoms, they may include:  Dull pain.  A feeling of heaviness.  An enlargement of your scrotum, if the cyst is large.  How is this diagnosed? This condition is diagnosed based on a physical exam. You or your health care provider may notice the cyst when feeling your scrotum. Your provider may shine a light through (transilluminate) your scrotum to see if light will pass through the cyst. You may also have an ultrasound of your scrotum to rule out a tumor. How is this treated? Small spermatoceles do not need to be treated. If the spermatocele has grown large or is uncomfortable, surgery to remove the cyst may be recommended. Follow these instructions at home:  Watch your spermatocele for any changes.  Keep all follow-up visits as told by your health care provider. This is important. Contact a health care provider if:  Your spermatocele gets larger.  You have pain in your scrotum.  Your spermatocele comes back after treatment. This information is not intended to replace advice given to you by your health care provider. Make sure you discuss any questions you have with your health care provider. Document Released: 02/25/2016 Document Revised: 06/30/2016 Document Reviewed: 11/18/2015 Elsevier Interactive Patient Education  2018 [employer].                                                             Vasectomy                Patient Education and Post Care   If you were provided a sedative (Valium) you must have someone available to drive you home after your procedure. Avoid strenuous activity for 48 hours after your procedure. This includes any heavy lifting. You may return to work the next day, as long as no strenuous activity is required. Leave bandage in place for the next 24hrs. If you have any difficulty urinating remove you may remove bandage earlier. Then keep area clean and dry. You may shower after 24 hours. Do not take a bath or use a hot tub for five (5) days. You may apply ice or a cold pack to the scrotum as needed, for 10 minutes of every hours. (A bag of frozen peas will mold to the area). This may help reduce any pain or swelling. It may also be helpful to wear supportive underwear, such as briefs. Expect some mild pain, mild swelling of the scrotum and possible slight fluid leak from the site of the puncture. A gauze pad may be applied to the scrotum if there is any leakage from the puncture site. This drainage may continue for several days and is normal. You may continue your normal diet. After the procedure, you will be given 1-2 specimen cups. You need to do  sperm checks at 6 and/or 12 weeks. Please bring the specimen back to our office to be sent out for analysis. You may resume having sex 1 week after procedure, if comfortable enough. Until you are told that you are sterile, it is essential that you use another form of birth control until otherwise notified by our office. Take Extra-Strength Tylenol, Motrin or Advil as needed for any pain or discomfort. Follow the package directions regarding dose. Stronger prescription pain medication is provided, but most people do not find that it is necessary. If you experience unusual or severe pain that is not relieved by pain medication, excessive bleeding or drainage, excessive swelling or redness, foul odor or a fever over 101 F, please contact  our office.  [facility] [address], [place], [place] [address] [phone]

## 2018-10-01 NOTE — Anesthesia Preprocedure Evaluation (Signed)
Anesthesia Evaluation  Patient identified by MRN, date of birth, ID band Patient awake    Airway Mallampati: II  TM Distance: >3 FB     Dental   Pulmonary neg pulmonary ROS, neg recent URI,    breath sounds clear to auscultation       Cardiovascular Exercise Tolerance: Good negative cardio ROS   Rhythm:Regular Rate:Normal     Neuro/Psych    GI/Hepatic negative GI ROS, neg GERD  ,  Endo/Other  Hypothyroidism   Renal/GU      Musculoskeletal   Abdominal   Peds  Hematology   Anesthesia Other Findings   Reproductive/Obstetrics                             Anesthesia Physical Anesthesia Plan  ASA: II  Anesthesia Plan: General   Post-op Pain Management:    Induction: Intravenous  PONV Risk Score and Plan: 2 and Ondansetron and Dexamethasone  Airway Management Planned: LMA  Additional Equipment:   Intra-op Plan:   Post-operative Plan:   Informed Consent: I have reviewed the patients History and Physical, chart, labs and discussed the procedure including the risks, benefits and alternatives for the proposed anesthesia with the patient or authorized representative who has indicated his/her understanding and acceptance.   Dental advisory given  Plan Discussed with: CRNA  Anesthesia Plan Comments:         Anesthesia Quick Evaluation

## 2018-10-01 NOTE — Anesthesia Postprocedure Evaluation (Signed)
Anesthesia Post Note  Patient: Allen Long  Procedure(s) Performed: SPERMATOCELECTOMY (Right Scrotum) VASECTOMY (Bilateral Scrotum)  Patient location during evaluation: PACU Anesthesia Type: General Level of consciousness: awake Pain management: pain level controlled Vital Signs Assessment: post-procedure vital signs reviewed and stable Respiratory status: respiratory function stable Cardiovascular status: stable Postop Assessment: no signs of nausea or vomiting Anesthetic complications: no    Veda Canning

## 2018-10-01 NOTE — Op Note (Signed)
Date of procedure: 10/01/18  Preoperative diagnosis:  1. Right spermatocele 2. Encounter for vasectomy  Postoperative diagnosis:  1. Same as above  Procedure: 1. Bilateral vasectomy 2. Right spermatocelectomy  Surgeon: Hollice Espy, MD  Anesthesia: General  Complications: None  Intraoperative findings: Small 1.5 cm right spermatocele.  Uncomplicated bilateral vasectomy.  EBL: Minimal  Specimens: Right spermatocele  Drains: None  Indication: Allen Long is a 32 y.o. patient with minimally symptomatic right spermatocele desiring bilateral vasectomy and excision of a spermatocele.  After reviewing the management options for treatment, he elected to proceed with the above surgical procedure(s). We have discussed the potential benefits and risks of the procedure, side effects of the proposed treatment, the likelihood of the patient achieving the goals of the procedure, and any potential problems that might occur during the procedure or recuperation. Informed consent has been obtained.  Description of procedure:  The patient was taken to the operating room and general anesthesia was induced.  The patient was placed in the supine position, prepped and draped in the usual sterile fashion, and preoperative antibiotics were administered. A preoperative time-out was performed.   The left vas was isolated.  Lidocaine/Marcaine solution was injected into the skin and the tissue around the vas.  A small stab incision was created overlying the vas.  The vas was isolated using a ring forcep.  The vas was brought to level of the skin and the perivasal tissue was dissected free.  Approximately 1 centimeter portion of the vas was then resected and needlepoint electrocautery was used to destroy the lumen of the vas bilaterally.  A fascial interposition was achieved by covering the testicular end of the vas with perivasal tissue using 3-0 chromic.  The skin was then closed using a single interrupted  3-0 chromic suture.  Attention then was turned to the right spermatocele ectomy and vasectomy.  A 3 cm midline scrotal incision was performed after instilling lidocaine solution.  Bovie electrocautery was used to open the dark toes and tunica vaginalis.  The right testicle was delivered into the field.  The spermatocele was identified at the epididymal head, approximately 1.5 cm.  This was carefully dissected until the entire cyst was removed without disrupting it.  Careful hemostasis was then achieved.  Next, a portion of the convoluted vas was isolated.  Approximately 1 cm portion of the vas was then excised.  Needlepoint electrocautery was used to destroy the lumen of each of the ends of the vas.  A fascial interposition was also performed on the side using the same technique as previously described.  The wound and testicle was then irrigated.  The testicle was then replaced within the tunica vaginalis which was closed using a running 3-0 chromic suture.  The testicle was returned back into the right hemiscrotum in the normal anatomic position.  The dartos was closed using a running 3-0 Vicryl suture.  The pin was closed using interrupted 3-0 chromic suture.  Dermabond was then applied after the patient was clean and dry.  Scrotal fluffs and a scrotal support device were applied.  He was emerged from anesthesia, taken to PACU in stable condition without difficulty.  Plan: Patient will follow-up in 3 months to give a semen analysis.  All postoperative care was discussed extensively with the patient preop and with his wife postop.  Hollice Espy, M.D.

## 2018-10-01 NOTE — Transfer of Care (Signed)
Immediate Anesthesia Transfer of Care Note  Patient: Allen Long  Procedure(s) Performed: SPERMATOCELECTOMY (Right Scrotum) VASECTOMY (Bilateral Scrotum)  Patient Location: PACU  Anesthesia Type: General  Level of Consciousness: awake, alert  and patient cooperative  Airway and Oxygen Therapy: Patient Spontanous Breathing and Patient connected to supplemental oxygen  Post-op Assessment: Post-op Vital signs reviewed, Patient's Cardiovascular Status Stable, Respiratory Function Stable, Patent Airway and No signs of Nausea or vomiting  Post-op Vital Signs: Reviewed and stable  Complications: No apparent anesthesia complications

## 2018-10-01 NOTE — H&P (Signed)
10/01/18 Updated today  [facility] CTAB  No changes in medical history below.  All questions answered.    Allen Long 01/20/1986 [id number]  Referring provider: Owens Loffler, MD [place], [place] [address]     Chief Complaint  Patient presents with  . Follow-up    HPI: 32 year old male who presents today with follow-up of his scrotal ultrasound.  Please see previous note for details.    He has had an enlarged right hemiscrotum which is occasionally symptomatic.  He is also considering vasectomy.  Scrotal ultrasound shows a 1.5 cm simple right extratesticular cyst inferior to the right testicle consistent with spermatocele.  The ultrasound was otherwise unremarkable with normal bilateral testicles.   PMH: Past Medical History:  Diagnosis Date  . Hypothyroid     Surgical History:      Past Surgical History:  Procedure Laterality Date  . APPENDECTOMY    . FRONTAL SINUS EXPLORATION Bilateral 10/15/2017   Procedure: FRONTAL SINUS EXPLORATION;  Surgeon: Margaretha Sheffield, MD;  Location: [facility];  Service: ENT;  Laterality: Bilateral;  . HYPOSPADIAS CORRECTION     as infant  . IMAGE GUIDED SINUS SURGERY Bilateral 10/15/2017   Procedure: IMAGE GUIDED SINUS SURGERY;  Surgeon: Margaretha Sheffield, MD;  Location: [facility];  Service: ENT;  Laterality: Bilateral;  . MASS EXCISION     scar tissue from prior surgery  . NASAL SEPTOPLASTY W/ TURBINOPLASTY Bilateral 10/15/2017   Procedure: NASAL SEPTOPLASTY WITH TURBINATE REDUCTION;  Surgeon: Margaretha Sheffield, MD;  Location: [facility];  Service: ENT;  Laterality: Bilateral;  . SEPTOPLASTY WITH ETHMOIDECTOMY, AND MAXILLARY ANTROSTOMY Bilateral 10/15/2017   Procedure: SEPTOPLASTY WITH ETHMOIDECTOMY, AND MAXILLARY ANTROSTOMY;  Surgeon: Margaretha Sheffield, MD;  Location: [facility];  Service: ENT;  Laterality: Bilateral;  . SPHENOIDECTOMY Bilateral 10/15/2017    Procedure: SPHENOIDECTOMY;  Surgeon: Margaretha Sheffield, MD;  Location: [facility];  Service: ENT;  Laterality: Bilateral;    Home Medications:       Allergies as of 08/27/2018      Reactions   Penicillins    REACTION: Rash as a child (has had Augmentin and Keflex with NO reaction)               Medication List            Accurate as of 08/27/18 11:59 PM. Always use your most recent med list.           FISH OIL PO Take 1 capsule by mouth daily.   levocetirizine 5 MG tablet Commonly known as:  XYZAL Take 5 mg by mouth daily.   levothyroxine 175 MCG tablet Commonly known as:  SYNTHROID, LEVOTHROID Take 175 mcg by mouth daily before breakfast. Mon - Fri   levothyroxine 200 MCG tablet Commonly known as:  SYNTHROID, LEVOTHROID Take 200 mcg by mouth daily before breakfast. Sat, Sun   multivitamin tablet Take 1 tablet by mouth daily.   XHANCE 93 MCG/ACT Exhu Generic drug:  Fluticasone Propionate Place 1 spray into both nostrils 2 (two) times daily.       Allergies:       Allergies  Allergen Reactions  . Penicillins     REACTION: Rash as a child (has had Augmentin and Keflex with NO reaction)    Family History:      Family History  Problem Relation Age of Onset  . Prostate cancer Neg Hx   . Bladder Cancer Neg Hx   . Kidney cancer  Neg Hx     Social History:  reports that he has never smoked. He has never used smokeless tobacco. He reports that he drinks about 3.0 standard drinks of alcohol per week. He reports that he does not use drugs.  ROS: UROLOGY Frequent Urination?: No Hard to postpone urination?: No Burning/pain with urination?: No Get up at night to urinate?: No Leakage of urine?: No Urine stream starts and stops?: No Trouble starting stream?: No Do you have to strain to urinate?: No Blood in urine?: No Urinary tract infection?: No Sexually transmitted disease?: No Injury to kidneys or bladder?:  No Painful intercourse?: No Weak stream?: No Erection problems?: No Penile pain?: No  Gastrointestinal Nausea?: No Vomiting?: No Indigestion/heartburn?: No Diarrhea?: No Constipation?: No  Constitutional Fever: No Night sweats?: No Weight loss?: No Fatigue?: No  Skin Skin rash/lesions?: No Itching?: No  Eyes Blurred vision?: No Double vision?: No  Ears/Nose/Throat Sore throat?: No Sinus problems?: No  Hematologic/Lymphatic Swollen glands?: No Easy bruising?: No  Cardiovascular Leg swelling?: No Chest pain?: No  Respiratory Cough?: No Shortness of breath?: No  Endocrine Excessive thirst?: No  Musculoskeletal Back pain?: No Joint pain?: No  Neurological Headaches?: No Dizziness?: No  Psychologic Depression?: No Anxiety?: No  Physical Exam: BP 120/68   Pulse 66   Ht 6\' 1"  (1.854 m)   Wt 185 lb (83.9 kg)   BMI 24.41 kg/m   Constitutional:  Alert and oriented, No acute distress. HEENT: [place] AT, moist mucus membranes.  Trachea midline, no masses. Cardiovascular: No clubbing, cyanosis, or edema. Respiratory: Normal respiratory effort, no increased work of breathing. Skin: No rashes, bruises or suspicious lesions. Neurologic: Grossly intact, no focal deficits, moving all 4 extremities. Psychiatric: Normal mood and affect.  Laboratory Data: RecentLabs       Lab Results  Component Value Date   WBC 4.5 04/10/2018   HGB 13.6 04/11/2018   HCT 39.4 (L) 04/10/2018   MCV 84.9 04/10/2018   PLT 168 04/10/2018      RecentLabs       Lab Results  Component Value Date   CREATININE 0.95 04/10/2018      Urinalysis Labs(Brief)     Component Value Date/Time   APPEARANCEUR Clear 08/06/2018 0823   GLUCOSEU Negative 08/06/2018 0823   BILIRUBINUR Negative 08/06/2018 0823   PROTEINUR Negative 08/06/2018 0823   NITRITE Negative 08/06/2018 0823   LEUKOCYTESUR Negative 08/06/2018 0823      RecentLabs  Lab  Results  Component Value Date   LABMICR See below: 08/06/2018   WBCUA None seen 08/06/2018   RBCUA 0-2 08/06/2018   LABEPIT None seen 08/06/2018   BACTERIA None seen 08/06/2018      Pertinent Imaging: CLINICAL DATA: Initial evaluation for spermatocele.  EXAM: SCROTAL ULTRASOUND  DOPPLER ULTRASOUND OF THE TESTICLES  TECHNIQUE: Complete ultrasound examination of the testicles, epididymis, and other scrotal structures was performed. Color and spectral Doppler ultrasound were also utilized to evaluate blood flow to the testicles.  COMPARISON: None.  FINDINGS: Right testicle  Measurements: 5.4 x 2.5 x 3.2 cm. No mass or microlithiasis visualized. 1.5 x 1.2 x 1.2 cm simple cyst at the inferior aspect of the right testicle, most consistent with a spermatocele.  Left testicle  Measurements: 5.4 x 2.4 x 3.3 cm. No mass or microlithiasis visualized.  Right epididymis: Normal in size and appearance.  Left epididymis: Normal in size and appearance.  Hydrocele: Trace bilateral hydroceles, of doubtful significance.  Varicocele: None visualized.  Pulsed Doppler interrogation of  both testes demonstrates normal low resistance arterial and venous waveforms bilaterally.  IMPRESSION: 1. 1.5 cm simple right extratesticular cyst, just inferior to the right testicle, most consistent with a spermatocele. 2. Otherwise unremarkable scrotal ultrasound.   Electronically Signed By: Jeannine Boga M.D. On: 08/16/2018 17:43  Scrotal ultrasound personally reviewed today.  Assessment & Plan:    1. Spermatocele of epididymis, single Mildly symptomatic right spermatocele Patient would like to have vasectomy and would prefer to have spermatocele excised concomitantly which is reasonable I have suggested this be done in the operating room under anesthesia complexity He is agreeable this plan We reviewed the risks including risk of bleeding,  infection, recurrence of a spermatocele, hematoma, chronic pain amongst others.  All questions answered.  2. Vasectomy evaluation Risk and benefits previously discussed, see previous note for details.  All additional questions answered today.  Schedule vasectomy/right spermatocelecomy.Hollice Espy, MD  [facility] [address] [place], [place] [address] [phone]

## 2018-10-04 ENCOUNTER — Encounter: Payer: Self-pay | Admitting: Urology

## 2018-10-04 DIAGNOSIS — J301 Allergic rhinitis due to pollen: Secondary | ICD-10-CM | POA: Diagnosis not present

## 2018-10-05 LAB — SURGICAL PATHOLOGY

## 2018-10-11 DIAGNOSIS — J301 Allergic rhinitis due to pollen: Secondary | ICD-10-CM | POA: Diagnosis not present

## 2018-10-18 DIAGNOSIS — J301 Allergic rhinitis due to pollen: Secondary | ICD-10-CM | POA: Diagnosis not present

## 2018-10-25 DIAGNOSIS — J301 Allergic rhinitis due to pollen: Secondary | ICD-10-CM | POA: Diagnosis not present

## 2018-11-01 DIAGNOSIS — J301 Allergic rhinitis due to pollen: Secondary | ICD-10-CM | POA: Diagnosis not present

## 2018-11-08 DIAGNOSIS — J301 Allergic rhinitis due to pollen: Secondary | ICD-10-CM | POA: Diagnosis not present

## 2018-11-12 DIAGNOSIS — J301 Allergic rhinitis due to pollen: Secondary | ICD-10-CM | POA: Diagnosis not present

## 2018-11-23 DIAGNOSIS — J301 Allergic rhinitis due to pollen: Secondary | ICD-10-CM | POA: Diagnosis not present

## 2018-11-30 DIAGNOSIS — Z1283 Encounter for screening for malignant neoplasm of skin: Secondary | ICD-10-CM | POA: Diagnosis not present

## 2018-11-30 DIAGNOSIS — D225 Melanocytic nevi of trunk: Secondary | ICD-10-CM | POA: Diagnosis not present

## 2018-11-30 DIAGNOSIS — D485 Neoplasm of uncertain behavior of skin: Secondary | ICD-10-CM | POA: Diagnosis not present

## 2018-11-30 DIAGNOSIS — D229 Melanocytic nevi, unspecified: Secondary | ICD-10-CM | POA: Diagnosis not present

## 2018-11-30 DIAGNOSIS — D2230 Melanocytic nevi of unspecified part of face: Secondary | ICD-10-CM | POA: Diagnosis not present

## 2018-11-30 DIAGNOSIS — J301 Allergic rhinitis due to pollen: Secondary | ICD-10-CM | POA: Diagnosis not present

## 2018-12-07 DIAGNOSIS — J301 Allergic rhinitis due to pollen: Secondary | ICD-10-CM | POA: Diagnosis not present

## 2018-12-14 DIAGNOSIS — J301 Allergic rhinitis due to pollen: Secondary | ICD-10-CM | POA: Diagnosis not present

## 2018-12-20 DIAGNOSIS — J301 Allergic rhinitis due to pollen: Secondary | ICD-10-CM | POA: Diagnosis not present

## 2018-12-21 IMAGING — US US SCROTUM
1 series · 14 of 25 positions shown · non-contrast
Comparison: None.

CLINICAL DATA: Initial evaluation for spermatocele.

EXAM:
SCROTAL ULTRASOUND
DOPPLER ULTRASOUND OF THE TESTICLES
TECHNIQUE: Complete ultrasound examination of the testicles, epididymis, and
other scrotal structures was performed. Color and spectral Doppler
ultrasound were also utilized to evaluate blood flow to the
testicles.

[Series 1: us scrotum · 0.07mm/px · 14 of 80 slices shown]
[im 1/80]
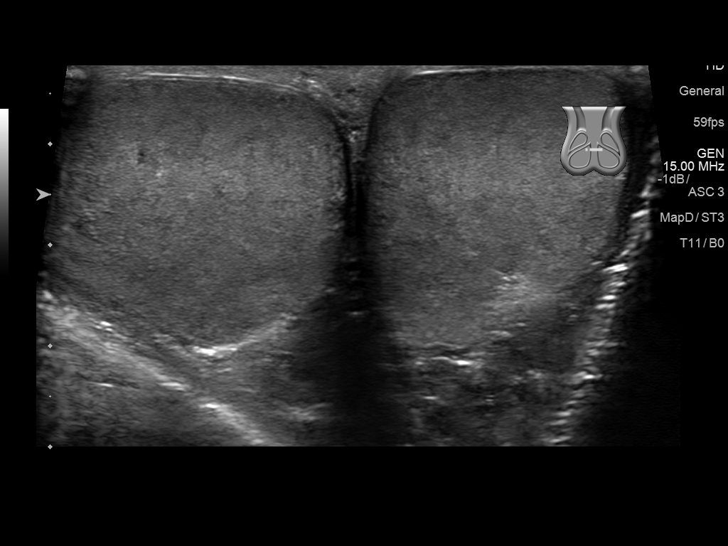
[im 7/80]
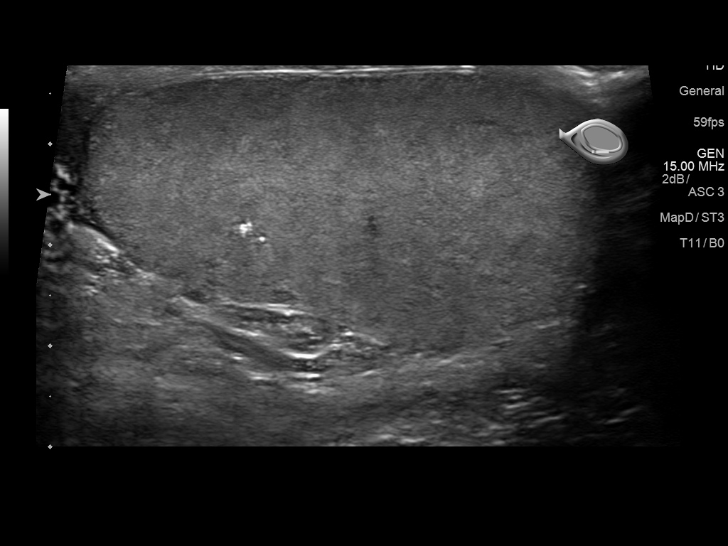
[im 14/80]
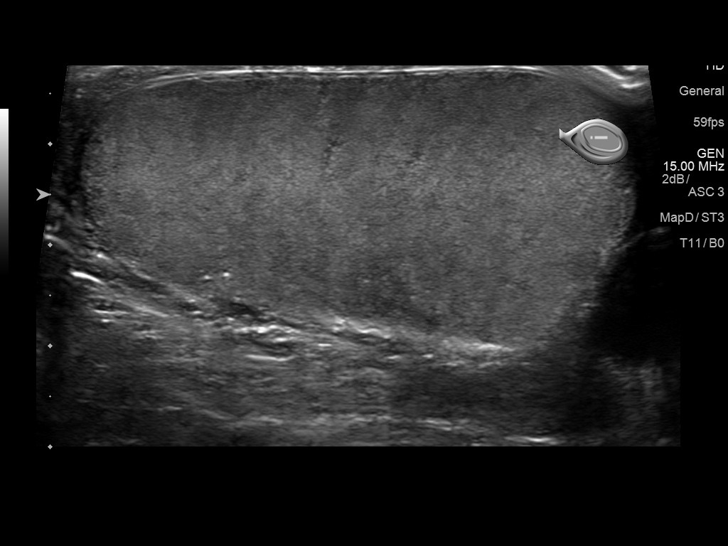
[im 20/80]
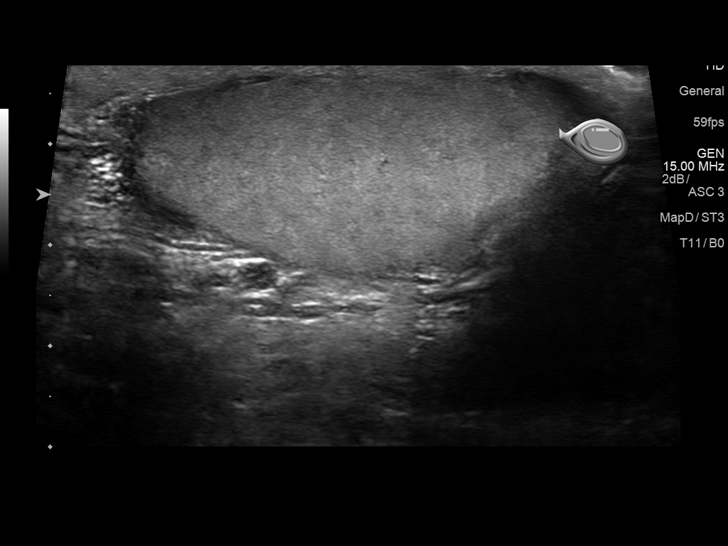
[im 27/80]
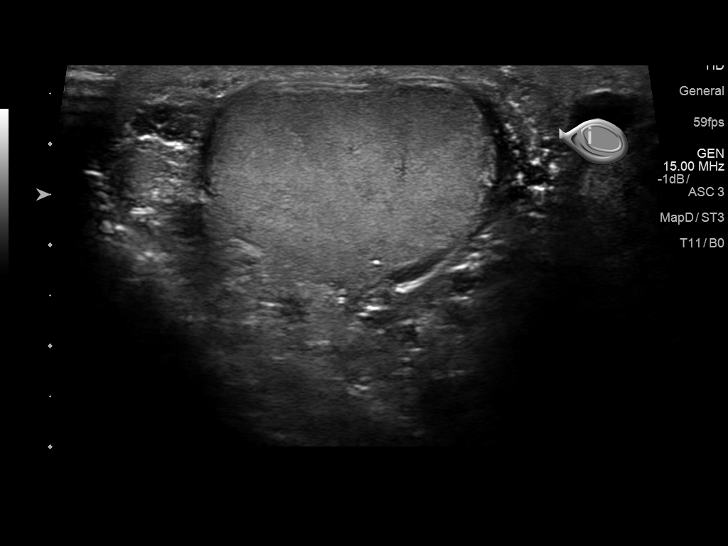
[im 30/80]
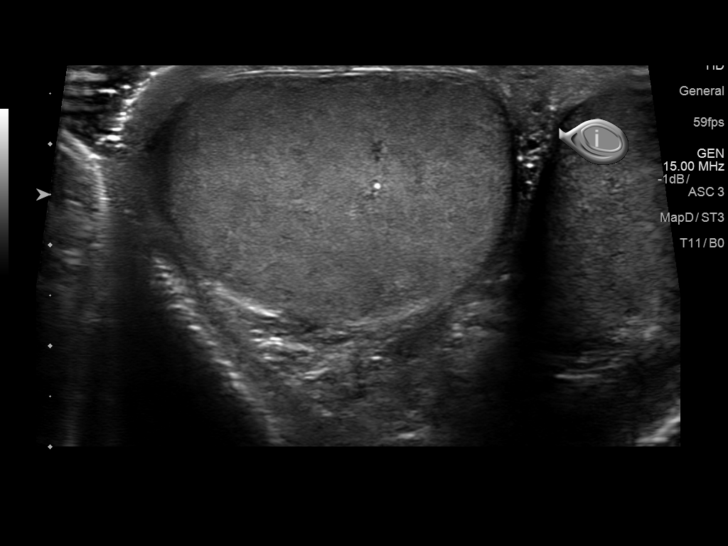
[im 37/80]
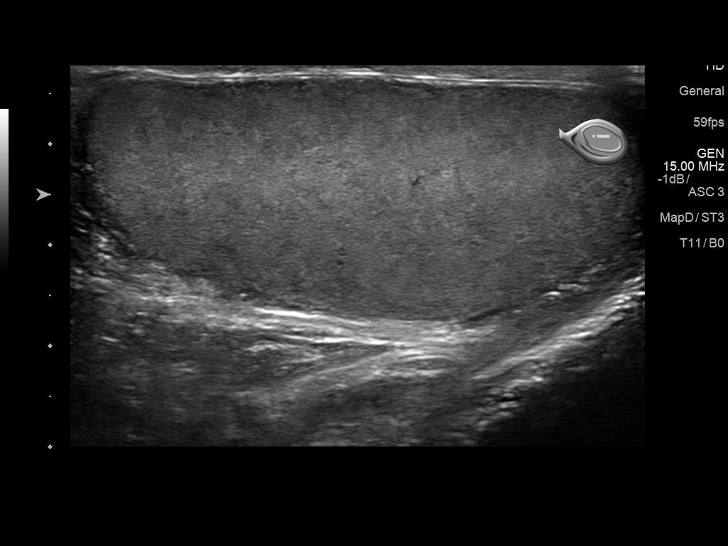
[im 43/80]
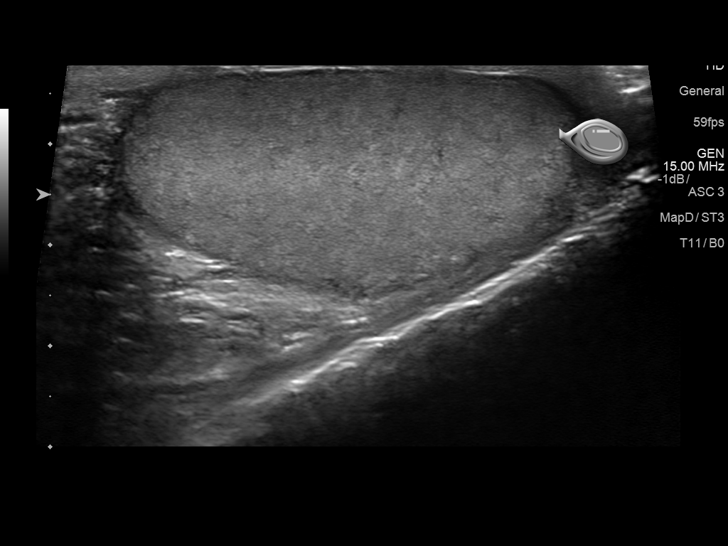
[im 50/80]
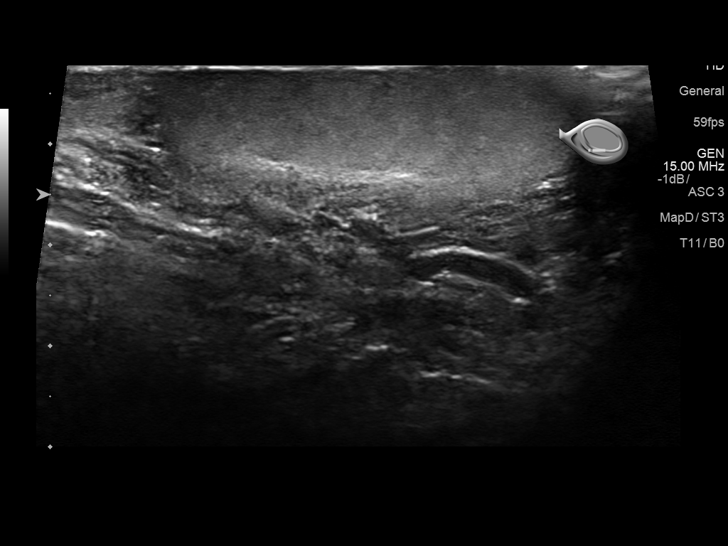
[im 53/80]
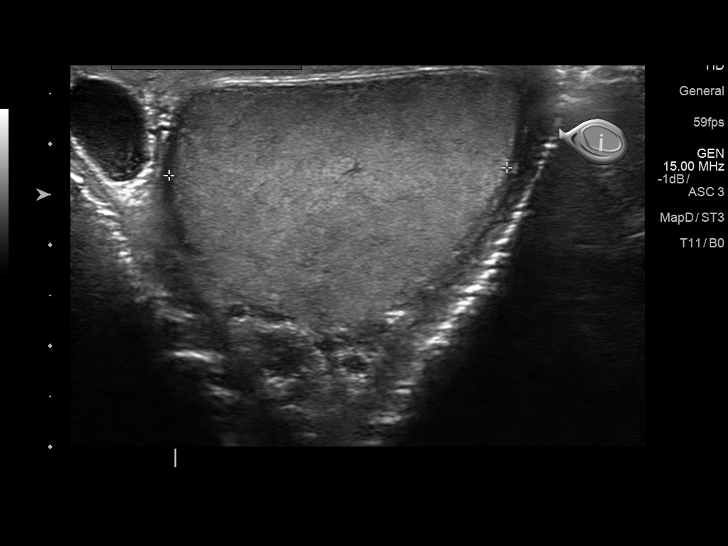
[im 60/80]
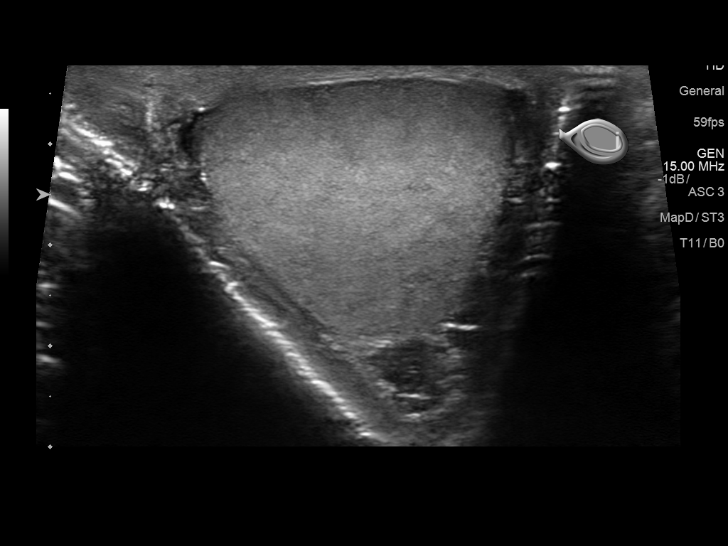
[im 66/80]
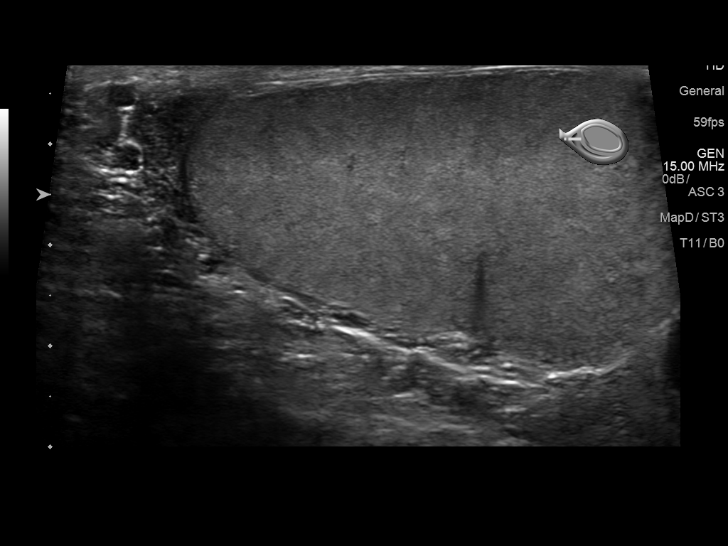
[im 73/80]
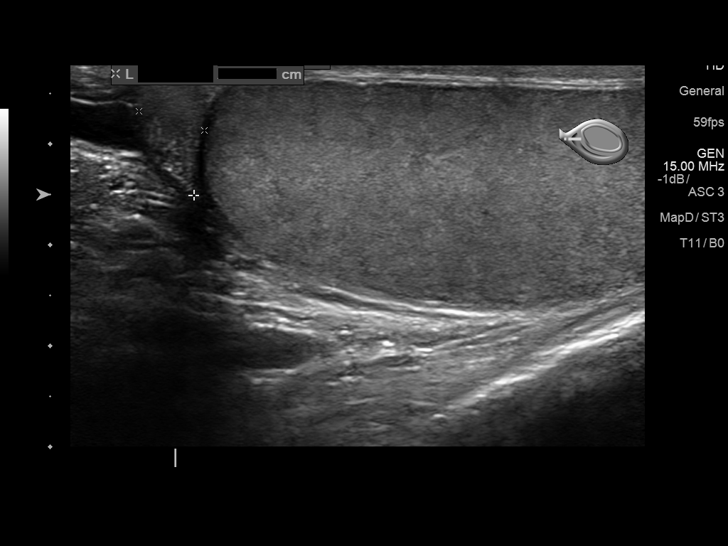
[im 80/80]
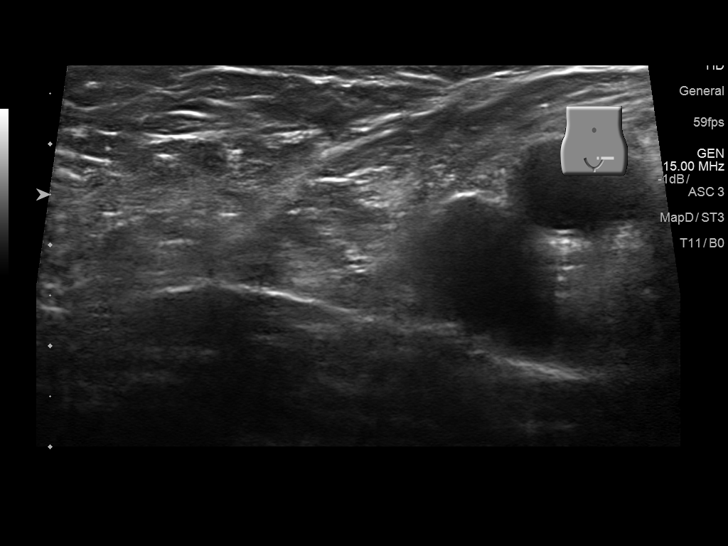

[14 of 25 positions shown; findings below may reference images not displayed]

FINDINGS: Right testicle

Measurements: 5.4 x 2.5 x 3.2 cm. No mass or microlithiasis
visualized. 1.5 x 1.2 x 1.2 cm simple cyst at the inferior aspect of
the right testicle, most consistent with a spermatocele.

Left testicle

Measurements: 5.4 x 2.4 x 3.3 cm. No mass or microlithiasis
visualized.

Right epididymis:  Normal in size and appearance.

Left epididymis:  Normal in size and appearance.

Hydrocele:  Trace bilateral hydroceles, of doubtful significance.

Varicocele:  None visualized.

Pulsed Doppler interrogation of both testes demonstrates normal low
resistance arterial and venous waveforms bilaterally.
IMPRESSION: 1. 1.5 cm simple right extratesticular cyst, just inferior to the
right testicle, most consistent with a spermatocele.
2. Otherwise unremarkable scrotal ultrasound.

## 2018-12-22 ENCOUNTER — Ambulatory Visit: Payer: BLUE CROSS/BLUE SHIELD | Admitting: Family Medicine

## 2018-12-22 ENCOUNTER — Encounter: Payer: Self-pay | Admitting: Family Medicine

## 2018-12-22 VITALS — BP 142/82 | HR 77 | Temp 97.7°F | Ht 72.5 in | Wt 188.0 lb

## 2018-12-22 DIAGNOSIS — J320 Chronic maxillary sinusitis: Secondary | ICD-10-CM | POA: Diagnosis not present

## 2018-12-22 MED ORDER — LEVOFLOXACIN 500 MG PO TABS: 500.0000 mg | ORAL_TABLET | Freq: Every day | ORAL | 0 refills | Status: DC

## 2018-12-22 NOTE — Progress Notes (Signed)
 Dr.  T. , MD, [facility] [address] [place], [address] Phone: [phone] Fax: [phone]  12/22/2018  Patient: Allen Long, MRN: [id number], DOB: 08/01/1986, 32 y.o.  Primary Physician:  , , MD   Chief Complaint  Patient presents with  . Nasal Congestion    yellowish mucus  . Sinus Pressure   Subjective:   This 32 y.o. male patient presents with runny nose, sneezing, cough, sore throat, malaise and minimal / low-grade fever for 12 days. Now the primary complaint has become sinus pressure and pain behind the eyes and in the upper, anterior face on the R.  Had some sinus surgeries about a year ago.  Yellowish mucous.  About 12 days.   The patent denies sore throat as the primary complaint. Denies sthortness of breath/wheezing, high fever, chest pain, significant myalgia, otalgia, abdominal pain, changes in bowel or bladder.  PMH, PHS, Allergies, Problem List, Medications, Family History, and Social History have all been reviewed.  Patient Active Problem List   Diagnosis Date Noted  . GIB (gastrointestinal bleeding) 04/10/2018  . Generalized anxiety disorder 03/31/2018  . Hypothyroid     Past Medical History:  Diagnosis Date  . Hypothyroid     Past Surgical History:  Procedure Laterality Date  . APPENDECTOMY    . FRONTAL SINUS EXPLORATION Bilateral 10/15/2017   Procedure: FRONTAL SINUS EXPLORATION;  Surgeon: Juengel, Paul, MD;  Location: [facility];  Service: ENT;  Laterality: Bilateral;  . HYPOSPADIAS CORRECTION     as infant  . IMAGE GUIDED SINUS SURGERY Bilateral 10/15/2017   Procedure: IMAGE GUIDED SINUS SURGERY;  Surgeon: Juengel, Paul, MD;  Location: [facility];  Service: ENT;  Laterality: Bilateral;  . MASS EXCISION     scar tissue from prior surgery  . NASAL SEPTOPLASTY W/ TURBINOPLASTY Bilateral 10/15/2017   Procedure: NASAL SEPTOPLASTY WITH TURBINATE  REDUCTION;  Surgeon: Juengel, Paul, MD;  Location: [facility];  Service: ENT;  Laterality: Bilateral;  . SEPTOPLASTY WITH ETHMOIDECTOMY, AND MAXILLARY ANTROSTOMY Bilateral 10/15/2017   Procedure: SEPTOPLASTY WITH ETHMOIDECTOMY, AND MAXILLARY ANTROSTOMY;  Surgeon: Juengel, Paul, MD;  Location: [facility];  Service: ENT;  Laterality: Bilateral;  . SPERMATOCELECTOMY Right 10/01/2018   Procedure: SPERMATOCELECTOMY;  Surgeon: Brandon, Ashley, MD;  Location: [facility];  Service: Urology;  Laterality: Right;  vas kit needed  . SPHENOIDECTOMY Bilateral 10/15/2017   Procedure: SPHENOIDECTOMY;  Surgeon: Juengel, Paul, MD;  Location: [facility];  Service: ENT;  Laterality: Bilateral;  . VASECTOMY Bilateral 10/01/2018   Procedure: VASECTOMY;  Surgeon: Brandon, Ashley, MD;  Location: [facility];  Service: Urology;  Laterality: Bilateral;    Social History   Socioeconomic History  . Marital status: Married    Spouse name: Meredith  . Number of children: Not on file  . Years of education: Not on file  . Highest education level: Not on file  Occupational History  . Occupation: accountant    Comment: [employer]  Social Needs  . Financial resource strain: Not on file  . Food insecurity:    Worry: Not on file    Inability: Not on file  . Transportation needs:    Medical: Not on file    Non-medical: Not on file  Tobacco Use  . Smoking status: Never Smoker  . Smokeless tobacco: Never Used  Substance and Sexual Activity  . Alcohol use: Yes    Alcohol/week: 3.0 standard drinks    Types:   1 Glasses of wine, 2 Cans of beer per week  . Drug use: No  . Sexual activity: Yes    Partners: Female  Lifestyle  . Physical activity:    Days per week: Not on file    Minutes per session: Not on file  . Stress: Not on file  Relationships  . Social connections:    Talks on phone: Not on file    Gets together: Not on file    Attends religious service: Not on file      Active member of club or organization: Not on file    Attends meetings of clubs or organizations: Not on file    Relationship status: Not on file  . Intimate partner violence:    Fear of current or ex partner: Not on file    Emotionally abused: Not on file    Physically abused: Not on file    Forced sexual activity: Not on file  Other Topics Concern  . 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       [name] is wife.    From [place].   [employer] undergrad - wife works there.     Family History  Problem Relation Age of Onset  . Prostate cancer Neg Hx   . Bladder Cancer Neg Hx   . Kidney cancer Neg Hx     Allergies  Allergen Reactions  . Penicillins     REACTION: Rash as a child (has had Augmentin and Keflex with NO reaction)    Medication list reviewed and updated in full in [place].  ROS as above, eating and drinking - tolerating PO. Urinating normally. No excessive vomitting or diarrhea. O/w as above.  Objective:   Blood pressure (!) 142/82, pulse 77, temperature 97.7 F (36.5 C), temperature source Oral, height 6' 0.5" (1.842 m), weight 188 lb (85.3 kg).  GEN: WDWN, Non-toxic, Atraumatic, normocephalic. A and O x 3. HEENT: Oropharynx clear without exudate, MMM, no significant LAD, mild rhinnorhea Sinuses: Right Frontal, ethmoid, and maxillary: Tender at max Left Frontal, Ethmoid, and maxillary: non-Tender Ears: TM clear, COL visualized with good landmarks CV: RRR, no m/g/r. Pulm: CTA B, no wheezes, rhonchi, or crackles, normal respiratory effort. EXT: no c/c/e Psych: well oriented, neither depressed nor anxious in appearance  Assessment and Plan:   Left maxillary sinusitis  Acute sinusitis: ABX as below.   Reviewed symptomatic care as well as ABX in this case.   I informed the of an 8-fold increased risk of achilles tendon rupture, increased risk of rotator cuff tear, and increased risk of tendon injury in general associated  with fluoroquinolones. The patient indicated their understanding.   Follow-up: No follow-ups on file.  Meds ordered this encounter  Medications  . levofloxacin (LEVAQUIN) 500 MG tablet    Sig: Take 1 tablet (500 mg total) by mouth daily.    Dispense:  10 tablet    Refill:  0   Signed,  Hridhaan Yohn T. Rynlee Lisbon, MD  Patient's Medications  New Prescriptions   LEVOFLOXACIN (LEVAQUIN) 500 MG TABLET    Take 1 tablet (500 mg total) by mouth daily.  Previous Medications   LEVOCETIRIZINE (XYZAL) 5 MG TABLET    Take 5 mg by mouth daily.    LEVOTHYROXINE (SYNTHROID, LEVOTHROID) 175 MCG TABLET    Take 175 mcg by mouth daily before breakfast. Mon - Sat   LEVOTHYROXINE (SYNTHROID, LEVOTHROID) 200 MCG TABLET    Take 200 mcg by mouth daily  before breakfast. Sun   MULTIPLE VITAMIN (MULTIVITAMIN) TABLET    Take 1 tablet by mouth daily.   OMEGA-3 FATTY ACIDS (FISH OIL PO)    Take 1 capsule by mouth daily.    XHANCE 93 MCG/ACT EXHU    Place 1 spray into both nostrils 2 (two) times daily.  Modified Medications   No medications on file  Discontinued Medications   DOCUSATE SODIUM (COLACE) 100 MG CAPSULE    Take 1 capsule (100 mg total) by mouth 2 (two) times daily.   HYDROCODONE-ACETAMINOPHEN (NORCO/VICODIN) 5-325 MG TABLET    Take 1-2 tablets by mouth every 6 (six) hours as needed for moderate pain.

## 2018-12-27 ENCOUNTER — Ambulatory Visit: Payer: BLUE CROSS/BLUE SHIELD | Admitting: Family Medicine

## 2018-12-28 DIAGNOSIS — J301 Allergic rhinitis due to pollen: Secondary | ICD-10-CM | POA: Diagnosis not present

## 2019-01-04 DIAGNOSIS — J301 Allergic rhinitis due to pollen: Secondary | ICD-10-CM | POA: Diagnosis not present

## 2019-01-11 DIAGNOSIS — J301 Allergic rhinitis due to pollen: Secondary | ICD-10-CM | POA: Diagnosis not present

## 2019-01-18 DIAGNOSIS — J301 Allergic rhinitis due to pollen: Secondary | ICD-10-CM | POA: Diagnosis not present

## 2019-01-18 DIAGNOSIS — E039 Hypothyroidism, unspecified: Secondary | ICD-10-CM | POA: Diagnosis not present

## 2019-01-25 DIAGNOSIS — J301 Allergic rhinitis due to pollen: Secondary | ICD-10-CM | POA: Diagnosis not present

## 2019-01-28 DIAGNOSIS — E039 Hypothyroidism, unspecified: Secondary | ICD-10-CM | POA: Diagnosis not present

## 2019-01-28 DIAGNOSIS — J301 Allergic rhinitis due to pollen: Secondary | ICD-10-CM | POA: Diagnosis not present

## 2019-02-01 DIAGNOSIS — J301 Allergic rhinitis due to pollen: Secondary | ICD-10-CM | POA: Diagnosis not present

## 2019-02-02 DIAGNOSIS — J301 Allergic rhinitis due to pollen: Secondary | ICD-10-CM | POA: Diagnosis not present

## 2019-03-25 DIAGNOSIS — J301 Allergic rhinitis due to pollen: Secondary | ICD-10-CM | POA: Diagnosis not present

## 2019-03-29 DIAGNOSIS — J301 Allergic rhinitis due to pollen: Secondary | ICD-10-CM | POA: Diagnosis not present

## 2019-04-05 DIAGNOSIS — J301 Allergic rhinitis due to pollen: Secondary | ICD-10-CM | POA: Diagnosis not present

## 2019-04-12 DIAGNOSIS — J301 Allergic rhinitis due to pollen: Secondary | ICD-10-CM | POA: Diagnosis not present

## 2019-04-19 DIAGNOSIS — J301 Allergic rhinitis due to pollen: Secondary | ICD-10-CM | POA: Diagnosis not present

## 2019-04-22 DIAGNOSIS — J301 Allergic rhinitis due to pollen: Secondary | ICD-10-CM | POA: Diagnosis not present

## 2019-04-26 DIAGNOSIS — J301 Allergic rhinitis due to pollen: Secondary | ICD-10-CM | POA: Diagnosis not present

## 2019-05-03 DIAGNOSIS — J301 Allergic rhinitis due to pollen: Secondary | ICD-10-CM | POA: Diagnosis not present

## 2019-05-10 DIAGNOSIS — J301 Allergic rhinitis due to pollen: Secondary | ICD-10-CM | POA: Diagnosis not present

## 2019-05-17 DIAGNOSIS — J301 Allergic rhinitis due to pollen: Secondary | ICD-10-CM | POA: Diagnosis not present

## 2019-05-24 DIAGNOSIS — J301 Allergic rhinitis due to pollen: Secondary | ICD-10-CM | POA: Diagnosis not present

## 2019-05-31 DIAGNOSIS — J301 Allergic rhinitis due to pollen: Secondary | ICD-10-CM | POA: Diagnosis not present

## 2019-06-07 DIAGNOSIS — J301 Allergic rhinitis due to pollen: Secondary | ICD-10-CM | POA: Diagnosis not present

## 2019-06-21 DIAGNOSIS — J301 Allergic rhinitis due to pollen: Secondary | ICD-10-CM | POA: Diagnosis not present

## 2019-06-28 DIAGNOSIS — J301 Allergic rhinitis due to pollen: Secondary | ICD-10-CM | POA: Diagnosis not present

## 2019-07-12 DIAGNOSIS — J301 Allergic rhinitis due to pollen: Secondary | ICD-10-CM | POA: Diagnosis not present

## 2019-07-19 DIAGNOSIS — J301 Allergic rhinitis due to pollen: Secondary | ICD-10-CM | POA: Diagnosis not present

## 2019-07-19 DIAGNOSIS — E039 Hypothyroidism, unspecified: Secondary | ICD-10-CM | POA: Diagnosis not present

## 2019-07-20 DIAGNOSIS — J301 Allergic rhinitis due to pollen: Secondary | ICD-10-CM | POA: Diagnosis not present

## 2019-07-26 ENCOUNTER — Ambulatory Visit (INDEPENDENT_AMBULATORY_CARE_PROVIDER_SITE_OTHER): Payer: BLUE CROSS/BLUE SHIELD

## 2019-07-26 DIAGNOSIS — Z23 Encounter for immunization: Secondary | ICD-10-CM | POA: Diagnosis not present

## 2019-07-29 DIAGNOSIS — E039 Hypothyroidism, unspecified: Secondary | ICD-10-CM | POA: Diagnosis not present

## 2019-07-29 DIAGNOSIS — J301 Allergic rhinitis due to pollen: Secondary | ICD-10-CM | POA: Diagnosis not present

## 2019-08-02 DIAGNOSIS — J301 Allergic rhinitis due to pollen: Secondary | ICD-10-CM | POA: Diagnosis not present

## 2019-08-09 DIAGNOSIS — J301 Allergic rhinitis due to pollen: Secondary | ICD-10-CM | POA: Diagnosis not present

## 2019-08-16 DIAGNOSIS — J301 Allergic rhinitis due to pollen: Secondary | ICD-10-CM | POA: Diagnosis not present

## 2019-08-23 DIAGNOSIS — J301 Allergic rhinitis due to pollen: Secondary | ICD-10-CM | POA: Diagnosis not present

## 2019-08-30 DIAGNOSIS — J301 Allergic rhinitis due to pollen: Secondary | ICD-10-CM | POA: Diagnosis not present

## 2019-09-06 DIAGNOSIS — J301 Allergic rhinitis due to pollen: Secondary | ICD-10-CM | POA: Diagnosis not present

## 2019-09-13 DIAGNOSIS — J301 Allergic rhinitis due to pollen: Secondary | ICD-10-CM | POA: Diagnosis not present

## 2019-09-20 DIAGNOSIS — J301 Allergic rhinitis due to pollen: Secondary | ICD-10-CM | POA: Diagnosis not present

## 2019-09-27 DIAGNOSIS — J301 Allergic rhinitis due to pollen: Secondary | ICD-10-CM | POA: Diagnosis not present

## 2019-10-04 DIAGNOSIS — J301 Allergic rhinitis due to pollen: Secondary | ICD-10-CM | POA: Diagnosis not present

## 2019-10-11 DIAGNOSIS — J301 Allergic rhinitis due to pollen: Secondary | ICD-10-CM | POA: Diagnosis not present

## 2019-10-18 DIAGNOSIS — J301 Allergic rhinitis due to pollen: Secondary | ICD-10-CM | POA: Diagnosis not present

## 2019-10-25 DIAGNOSIS — J301 Allergic rhinitis due to pollen: Secondary | ICD-10-CM | POA: Diagnosis not present

## 2019-10-31 DIAGNOSIS — J301 Allergic rhinitis due to pollen: Secondary | ICD-10-CM | POA: Diagnosis not present

## 2019-11-07 DIAGNOSIS — J301 Allergic rhinitis due to pollen: Secondary | ICD-10-CM | POA: Diagnosis not present

## 2019-11-21 DIAGNOSIS — J301 Allergic rhinitis due to pollen: Secondary | ICD-10-CM | POA: Diagnosis not present

## 2019-11-28 DIAGNOSIS — J301 Allergic rhinitis due to pollen: Secondary | ICD-10-CM | POA: Diagnosis not present

## 2019-12-05 DIAGNOSIS — D229 Melanocytic nevi, unspecified: Secondary | ICD-10-CM | POA: Diagnosis not present

## 2019-12-05 DIAGNOSIS — Z86018 Personal history of other benign neoplasm: Secondary | ICD-10-CM | POA: Diagnosis not present

## 2019-12-05 DIAGNOSIS — D225 Melanocytic nevi of trunk: Secondary | ICD-10-CM | POA: Diagnosis not present

## 2019-12-05 DIAGNOSIS — Z1283 Encounter for screening for malignant neoplasm of skin: Secondary | ICD-10-CM | POA: Diagnosis not present

## 2019-12-05 DIAGNOSIS — J301 Allergic rhinitis due to pollen: Secondary | ICD-10-CM | POA: Diagnosis not present

## 2019-12-05 DIAGNOSIS — D2230 Melanocytic nevi of unspecified part of face: Secondary | ICD-10-CM | POA: Diagnosis not present

## 2019-12-12 DIAGNOSIS — J301 Allergic rhinitis due to pollen: Secondary | ICD-10-CM | POA: Diagnosis not present

## 2019-12-19 DIAGNOSIS — J301 Allergic rhinitis due to pollen: Secondary | ICD-10-CM | POA: Diagnosis not present

## 2019-12-26 DIAGNOSIS — J301 Allergic rhinitis due to pollen: Secondary | ICD-10-CM | POA: Diagnosis not present

## 2020-01-02 DIAGNOSIS — J301 Allergic rhinitis due to pollen: Secondary | ICD-10-CM | POA: Diagnosis not present

## 2020-01-04 DIAGNOSIS — J301 Allergic rhinitis due to pollen: Secondary | ICD-10-CM | POA: Diagnosis not present

## 2020-01-09 DIAGNOSIS — J301 Allergic rhinitis due to pollen: Secondary | ICD-10-CM | POA: Diagnosis not present

## 2020-01-16 DIAGNOSIS — J301 Allergic rhinitis due to pollen: Secondary | ICD-10-CM | POA: Diagnosis not present

## 2020-01-23 DIAGNOSIS — J301 Allergic rhinitis due to pollen: Secondary | ICD-10-CM | POA: Diagnosis not present

## 2020-01-30 DIAGNOSIS — J301 Allergic rhinitis due to pollen: Secondary | ICD-10-CM | POA: Diagnosis not present

## 2020-02-04 ENCOUNTER — Ambulatory Visit: Payer: Self-pay | Attending: Internal Medicine

## 2020-02-04 ENCOUNTER — Other Ambulatory Visit: Payer: Self-pay

## 2020-02-04 DIAGNOSIS — Z23 Encounter for immunization: Secondary | ICD-10-CM

## 2020-02-04 NOTE — Progress Notes (Signed)
   Covid-19 Vaccination Clinic  Name:  Allen Long    MRN: [id number] DOB: September 18, 1986  02/04/2020  Mr. Allen Long was observed post Covid-19 immunization for 15 minutes without incident. He was provided with Vaccine Information Sheet and instruction to access the V-Safe system.   Mr. Allen Long was instructed to call 911 with any severe reactions post vaccine: Marland Kitchen Difficulty breathing  . Swelling of face and throat  . A fast heartbeat  . A bad rash all over body  . Dizziness and weakness   Immunizations Administered    Name Date Dose VIS Date Route   Pfizer COVID-19 Vaccine 02/04/2020 10:32 AM 0.3 mL 10/28/2019 Intramuscular   Manufacturer: [place]   Lot: [id number]   [place]: [id number]

## 2020-02-06 DIAGNOSIS — J301 Allergic rhinitis due to pollen: Secondary | ICD-10-CM | POA: Diagnosis not present

## 2020-02-13 DIAGNOSIS — J301 Allergic rhinitis due to pollen: Secondary | ICD-10-CM | POA: Diagnosis not present

## 2020-02-27 DIAGNOSIS — J301 Allergic rhinitis due to pollen: Secondary | ICD-10-CM | POA: Diagnosis not present

## 2020-02-29 ENCOUNTER — Ambulatory Visit: Payer: Self-pay | Attending: Internal Medicine

## 2020-02-29 DIAGNOSIS — Z23 Encounter for immunization: Secondary | ICD-10-CM

## 2020-02-29 NOTE — Progress Notes (Signed)
   Covid-19 Vaccination Clinic  Name:  Allen Long    MRN: [id number] DOB: July 27, 1986  02/29/2020  Allen Long was observed post Covid-19 immunization for 15 minutes without incident. He was provided with Vaccine Information Sheet and instruction to access the V-Safe system.   Allen Long was instructed to call 911 with any severe reactions post vaccine: Marland Kitchen Difficulty breathing  . Swelling of face and throat  . A fast heartbeat  . A bad rash all over body  . Dizziness and weakness   Immunizations Administered    Name Date Dose VIS Date Route   Pfizer COVID-19 Vaccine 02/29/2020 10:20 AM 0.3 mL 10/28/2019 Intramuscular   Manufacturer: [employer]   Lot: [id number]   [place]: [id number]

## 2020-03-05 DIAGNOSIS — J301 Allergic rhinitis due to pollen: Secondary | ICD-10-CM | POA: Diagnosis not present

## 2020-03-06 ENCOUNTER — Other Ambulatory Visit: Payer: Self-pay

## 2020-03-06 ENCOUNTER — Telehealth: Payer: Self-pay

## 2020-03-06 ENCOUNTER — Ambulatory Visit (INDEPENDENT_AMBULATORY_CARE_PROVIDER_SITE_OTHER): Payer: BC Managed Care – PPO | Admitting: Dermatology

## 2020-03-06 DIAGNOSIS — L988 Other specified disorders of the skin and subcutaneous tissue: Secondary | ICD-10-CM | POA: Diagnosis not present

## 2020-03-06 DIAGNOSIS — D485 Neoplasm of uncertain behavior of skin: Secondary | ICD-10-CM

## 2020-03-06 DIAGNOSIS — D1801 Hemangioma of skin and subcutaneous tissue: Secondary | ICD-10-CM

## 2020-03-06 MED ORDER — MUPIROCIN 2 % EX OINT: 1.0000 "application " | TOPICAL_OINTMENT | Freq: Every day | CUTANEOUS | 0 refills | Status: DC

## 2020-03-06 NOTE — Patient Instructions (Signed)

## 2020-03-06 NOTE — Telephone Encounter (Signed)
Spoke with patient regarding today's surgery. He is doing fine.

## 2020-03-06 NOTE — Progress Notes (Signed)
   Follow-Up Visit   Subjective  Allen Long is a 34 y.o. male who presents for the following: Procedure (Severe dysplastic nevus of right chest/parasternal).    The following portions of the chart were reviewed this encounter and updated as appropriate: Tobacco  Allergies  Meds  Problems  Med Hx  Surg Hx  Fam Hx      Review of Systems: No other skin or systemic complaints.  Objective  Well appearing patient in no apparent distress; mood and affect are within normal limits.  A focused examination was performed including chest. Relevant physical exam findings are noted in the Assessment and Plan.  Objective  Right chest: Red papules.   Objective  Right chest/parasternal: 0.9 x 0.5 cm Healing biopsy site.  Assessment & Plan  Hemangioma of skin Right chest  ED performed today.  Neoplasm of uncertain behavior of skin Right chest/parasternal  Skin excision  Lesion length (cm):  0.9 Lesion width (cm):  0.5 Margin per side (cm):  0.2 Total excision diameter (cm):  1.3 Informed consent: discussed and consent obtained   Timeout: patient name, date of birth, surgical site, and procedure verified   Procedure prep:  Patient was prepped and draped in usual sterile fashion Prep type:  Isopropyl alcohol and povidone-iodine Anesthesia: the lesion was anesthetized in a standard fashion   Anesthetic:  1% lidocaine w/ epinephrine 1-100,000 buffered w/ 8.4% NaHCO3 (cc) Instrument used: #15 blade   Instrument used comment:  15c Hemostasis achieved with: pressure   Hemostasis achieved with comment:  Electrocautery Outcome: patient tolerated procedure well with no complications   Post-procedure details: sterile dressing applied and wound care instructions given   Dressing type: bandage and pressure dressing (mupirocin)   Additional details:  Discussed risk of hypertrophic scar due to location of procedure.  Skin repair Complexity:  Complex Final length (cm):  4 Reason for  type of repair: reduce tension to allow closure, reduce the risk of dehiscence, infection, and necrosis, reduce subcutaneous dead space and avoid a hematoma, allow closure of the large defect, preserve normal anatomy, preserve normal anatomical and functional relationships and enhance both functionality and cosmetic results   Undermining: area extensively undermined   Undermining comment:  Undermining defect 1.5cm Subcutaneous layers (deep stitches):  Suture size:  3-0 Suture type: Vicryl (polyglactin 910)   Subcutaneous suture technique: inverted dermal. Fine/surface layer approximation (top stitches):  Suture size:  4-0 Suture type comment:  Nylon Stitches: simple running   Suture removal (days):  7 Hemostasis achieved with: suture and pressure Outcome: patient tolerated procedure well with no complications   Post-procedure details: sterile dressing applied and wound care instructions given   Dressing type: bandage and pressure dressing (mupirocin)    mupirocin ointment (BACTROBAN) 2 %  Specimen 1 - Surgical pathology Differential Diagnosis: Severe dysplastic nevus Check Margins: Yes 0.9 x 0.5 cm Healing biopsy site. [id number]  Return in about 1 week (around 03/13/2020).   I, Ashok Cordia, CMA, am acting as scribe for Sarina Ser, MD .  Documentation: I have reviewed the above documentation for accuracy and completeness, and I agree with the above.  Sarina Ser, MD

## 2020-03-07 ENCOUNTER — Encounter: Payer: Self-pay | Admitting: Dermatology

## 2020-03-12 DIAGNOSIS — J301 Allergic rhinitis due to pollen: Secondary | ICD-10-CM | POA: Diagnosis not present

## 2020-03-13 ENCOUNTER — Encounter: Payer: Self-pay | Admitting: Dermatology

## 2020-03-13 ENCOUNTER — Other Ambulatory Visit: Payer: Self-pay

## 2020-03-13 ENCOUNTER — Ambulatory Visit (INDEPENDENT_AMBULATORY_CARE_PROVIDER_SITE_OTHER): Payer: BC Managed Care – PPO | Admitting: Dermatology

## 2020-03-13 DIAGNOSIS — D239 Other benign neoplasm of skin, unspecified: Secondary | ICD-10-CM

## 2020-03-13 DIAGNOSIS — Z4802 Encounter for removal of sutures: Secondary | ICD-10-CM

## 2020-03-13 DIAGNOSIS — D235 Other benign neoplasm of skin of trunk: Secondary | ICD-10-CM

## 2020-03-13 NOTE — Progress Notes (Signed)
   Follow-Up Visit   Subjective  Allen Long is a 34 y.o. male who presents for the following: 1 week post op severe dysplastic nevus (R chest parasternal, sr today).   The following portions of the chart were reviewed this encounter and updated as appropriate:  Tobacco  Allergies  Meds  Problems  Med Hx  Surg Hx  Fam Hx      Review of Systems:  No other skin or systemic complaints except as noted in HPI or Assessment and Plan.  Objective  Well appearing patient in no apparent distress; mood and affect are within normal limits.  A focused examination was performed including right chest. Relevant physical exam findings are noted in the Assessment and Plan.  Objective  R chest/parasternal: Healing excision site   Assessment & Plan  Dysplastic nevus R chest/parasternal  Severe margins free.  Wound cleansed, sutures removed, wound cleansed and steri strips applied. Discussed pathology results.   Return for as scheduled for TBSE.    Documentation: I have reviewed the above documentation for accuracy and completeness, and I agree with the above.  Sarina Ser, MD   I, Othelia Pulling, RMA, am acting as scribe for Sarina Ser, MD .

## 2020-03-19 DIAGNOSIS — J301 Allergic rhinitis due to pollen: Secondary | ICD-10-CM | POA: Diagnosis not present

## 2020-03-26 DIAGNOSIS — J301 Allergic rhinitis due to pollen: Secondary | ICD-10-CM | POA: Diagnosis not present

## 2020-03-28 DIAGNOSIS — J301 Allergic rhinitis due to pollen: Secondary | ICD-10-CM | POA: Diagnosis not present

## 2020-04-02 DIAGNOSIS — J301 Allergic rhinitis due to pollen: Secondary | ICD-10-CM | POA: Diagnosis not present

## 2020-04-09 DIAGNOSIS — J301 Allergic rhinitis due to pollen: Secondary | ICD-10-CM | POA: Diagnosis not present

## 2020-04-23 DIAGNOSIS — J301 Allergic rhinitis due to pollen: Secondary | ICD-10-CM | POA: Diagnosis not present

## 2020-05-07 DIAGNOSIS — J301 Allergic rhinitis due to pollen: Secondary | ICD-10-CM | POA: Diagnosis not present

## 2020-05-14 DIAGNOSIS — J301 Allergic rhinitis due to pollen: Secondary | ICD-10-CM | POA: Diagnosis not present

## 2020-05-28 DIAGNOSIS — J301 Allergic rhinitis due to pollen: Secondary | ICD-10-CM | POA: Diagnosis not present

## 2020-06-04 DIAGNOSIS — J301 Allergic rhinitis due to pollen: Secondary | ICD-10-CM | POA: Diagnosis not present

## 2020-06-11 DIAGNOSIS — J301 Allergic rhinitis due to pollen: Secondary | ICD-10-CM | POA: Diagnosis not present

## 2020-06-20 DIAGNOSIS — J301 Allergic rhinitis due to pollen: Secondary | ICD-10-CM | POA: Diagnosis not present

## 2020-06-25 DIAGNOSIS — J301 Allergic rhinitis due to pollen: Secondary | ICD-10-CM | POA: Diagnosis not present

## 2020-07-02 DIAGNOSIS — J301 Allergic rhinitis due to pollen: Secondary | ICD-10-CM | POA: Diagnosis not present

## 2020-07-09 DIAGNOSIS — J301 Allergic rhinitis due to pollen: Secondary | ICD-10-CM | POA: Diagnosis not present

## 2020-07-16 DIAGNOSIS — J301 Allergic rhinitis due to pollen: Secondary | ICD-10-CM | POA: Diagnosis not present

## 2020-07-16 DIAGNOSIS — E039 Hypothyroidism, unspecified: Secondary | ICD-10-CM | POA: Diagnosis not present

## 2020-07-24 DIAGNOSIS — J301 Allergic rhinitis due to pollen: Secondary | ICD-10-CM | POA: Diagnosis not present

## 2020-07-27 DIAGNOSIS — J301 Allergic rhinitis due to pollen: Secondary | ICD-10-CM | POA: Diagnosis not present

## 2020-07-27 DIAGNOSIS — E039 Hypothyroidism, unspecified: Secondary | ICD-10-CM | POA: Diagnosis not present

## 2020-07-30 DIAGNOSIS — J301 Allergic rhinitis due to pollen: Secondary | ICD-10-CM | POA: Diagnosis not present

## 2020-08-06 DIAGNOSIS — J301 Allergic rhinitis due to pollen: Secondary | ICD-10-CM | POA: Diagnosis not present

## 2020-08-13 DIAGNOSIS — J301 Allergic rhinitis due to pollen: Secondary | ICD-10-CM | POA: Diagnosis not present

## 2020-08-20 DIAGNOSIS — J301 Allergic rhinitis due to pollen: Secondary | ICD-10-CM | POA: Diagnosis not present

## 2020-08-27 DIAGNOSIS — J301 Allergic rhinitis due to pollen: Secondary | ICD-10-CM | POA: Diagnosis not present

## 2020-09-03 DIAGNOSIS — J301 Allergic rhinitis due to pollen: Secondary | ICD-10-CM | POA: Diagnosis not present

## 2020-09-05 ENCOUNTER — Other Ambulatory Visit: Payer: Self-pay

## 2020-09-05 ENCOUNTER — Ambulatory Visit (INDEPENDENT_AMBULATORY_CARE_PROVIDER_SITE_OTHER): Payer: BC Managed Care – PPO

## 2020-09-05 DIAGNOSIS — Z23 Encounter for immunization: Secondary | ICD-10-CM | POA: Diagnosis not present

## 2020-09-10 DIAGNOSIS — J301 Allergic rhinitis due to pollen: Secondary | ICD-10-CM | POA: Diagnosis not present

## 2020-09-12 DIAGNOSIS — J301 Allergic rhinitis due to pollen: Secondary | ICD-10-CM | POA: Diagnosis not present

## 2020-09-17 DIAGNOSIS — J301 Allergic rhinitis due to pollen: Secondary | ICD-10-CM | POA: Diagnosis not present

## 2020-09-24 DIAGNOSIS — J301 Allergic rhinitis due to pollen: Secondary | ICD-10-CM | POA: Diagnosis not present

## 2020-10-15 DIAGNOSIS — J301 Allergic rhinitis due to pollen: Secondary | ICD-10-CM | POA: Diagnosis not present

## 2020-10-22 DIAGNOSIS — J301 Allergic rhinitis due to pollen: Secondary | ICD-10-CM | POA: Diagnosis not present

## 2020-10-29 ENCOUNTER — Ambulatory Visit: Payer: BC Managed Care – PPO | Attending: Internal Medicine

## 2020-10-29 DIAGNOSIS — Z23 Encounter for immunization: Secondary | ICD-10-CM

## 2020-10-29 NOTE — Progress Notes (Signed)
   Covid-19 Vaccination Clinic  Name:  JAQUE DACY    MRN: [id number] DOB: 1986/10/20  10/29/2020  Mr. Throne was observed post Covid-19 immunization for 15 minutes without incident. He was provided with Vaccine Information Sheet and instruction to access the V-Safe system.   Mr. Lamagna was instructed to call 911 with any severe reactions post vaccine: Marland Kitchen Difficulty breathing  . Swelling of face and throat  . A fast heartbeat  . A bad rash all over body  . Dizziness and weakness   Immunizations Administered    Name Date Dose VIS Date Route   Pfizer COVID-19 Vaccine 10/29/2020  1:03 PM 0.3 mL 09/05/2020 Intramuscular   Manufacturer: [place]   Lot: [id number]   [place]: [id number]

## 2020-11-06 DIAGNOSIS — J301 Allergic rhinitis due to pollen: Secondary | ICD-10-CM | POA: Diagnosis not present

## 2020-11-19 DIAGNOSIS — J301 Allergic rhinitis due to pollen: Secondary | ICD-10-CM | POA: Diagnosis not present

## 2020-11-26 DIAGNOSIS — J301 Allergic rhinitis due to pollen: Secondary | ICD-10-CM | POA: Diagnosis not present

## 2020-12-04 DIAGNOSIS — J301 Allergic rhinitis due to pollen: Secondary | ICD-10-CM | POA: Diagnosis not present

## 2020-12-05 DIAGNOSIS — J301 Allergic rhinitis due to pollen: Secondary | ICD-10-CM | POA: Diagnosis not present

## 2020-12-06 ENCOUNTER — Ambulatory Visit: Payer: BC Managed Care – PPO | Admitting: Dermatology

## 2020-12-06 ENCOUNTER — Other Ambulatory Visit: Payer: Self-pay

## 2020-12-06 DIAGNOSIS — Z1283 Encounter for screening for malignant neoplasm of skin: Secondary | ICD-10-CM | POA: Diagnosis not present

## 2020-12-06 DIAGNOSIS — L578 Other skin changes due to chronic exposure to nonionizing radiation: Secondary | ICD-10-CM

## 2020-12-06 DIAGNOSIS — Z86018 Personal history of other benign neoplasm: Secondary | ICD-10-CM

## 2020-12-06 DIAGNOSIS — D1800 Hemangioma unspecified site: Secondary | ICD-10-CM

## 2020-12-06 DIAGNOSIS — D229 Melanocytic nevi, unspecified: Secondary | ICD-10-CM

## 2020-12-06 DIAGNOSIS — L814 Other melanin hyperpigmentation: Secondary | ICD-10-CM | POA: Diagnosis not present

## 2020-12-06 DIAGNOSIS — D492 Neoplasm of unspecified behavior of bone, soft tissue, and skin: Secondary | ICD-10-CM | POA: Diagnosis not present

## 2020-12-06 DIAGNOSIS — L821 Other seborrheic keratosis: Secondary | ICD-10-CM

## 2020-12-06 NOTE — Progress Notes (Signed)
   Follow-Up Visit   Subjective  Allen Long is a 35 y.o. male who presents for the following: Total body skin exam (Hx of Dysplastic nevi). The patient presents for Total-Body Skin Exam (TBSE) for skin cancer screening and mole check.  The following portions of the chart were reviewed this encounter and updated as appropriate:   Tobacco  Allergies  Meds  Problems  Med Hx  Surg Hx  Fam Hx     Review of Systems:  No other skin or systemic complaints except as noted in HPI or Assessment and Plan.  Objective  Well appearing patient in no apparent distress; mood and affect are within normal limits.  A full examination was performed including scalp, head, eyes, ears, nose, lips, neck, chest, axillae, abdomen, back, buttocks, bilateral upper extremities, bilateral lower extremities, hands, feet, fingers, toes, fingernails, and toenails. All findings within normal limits unless otherwise noted below.  Objective  multiple: Scar with no evidence of recurrence.   Objective  L scalp: 0.6cm slightly irregular brown macule        Assessment & Plan  History of dysplastic nevus multiple Clear. Observe for recurrence. Call clinic for new or changing lesions.  Recommend regular skin exams, daily broad-spectrum spf 30+ sunscreen use, and photoprotection.     Neoplasm of skin L scalp Slightly Irregular Nevus, observe Photo today.   Lentigines - Scattered tan macules - Discussed due to sun exposure - Benign, observe - Call for any changes  Seborrheic Keratoses - Stuck-on, waxy, tan-brown papules and plaques  - Discussed benign etiology and prognosis. - Observe - Call for any changes  Melanocytic Nevi - Tan-brown and/or pink-flesh-colored symmetric macules and papules - Benign appearing on exam today - Observation - Call clinic for new or changing moles - Recommend daily use of broad spectrum spf 30+ sunscreen to sun-exposed areas.   Hemangiomas - Red papules -  Discussed benign nature - Observe - Call for any changes  Actinic Damage - Chronic, secondary to cumulative UV/sun exposure - diffuse scaly erythematous macules with underlying dyspigmentation - Recommend daily broad spectrum sunscreen SPF 30+ to sun-exposed areas, reapply every 2 hours as needed.  - Call for new or changing lesions.  Skin cancer screening performed today.  Return in about 1 year (around 12/06/2021) for TBSE, Hx of Dysplastic nevi.   I, Othelia Pulling, RMA, am acting as scribe for Sarina Ser, MD .  Documentation: I have reviewed the above documentation for accuracy and completeness, and I agree with the above.  Sarina Ser, MD

## 2020-12-10 ENCOUNTER — Encounter: Payer: Self-pay | Admitting: Dermatology

## 2020-12-11 DIAGNOSIS — J301 Allergic rhinitis due to pollen: Secondary | ICD-10-CM | POA: Diagnosis not present

## 2020-12-17 DIAGNOSIS — J301 Allergic rhinitis due to pollen: Secondary | ICD-10-CM | POA: Diagnosis not present

## 2020-12-24 DIAGNOSIS — J301 Allergic rhinitis due to pollen: Secondary | ICD-10-CM | POA: Diagnosis not present

## 2020-12-31 DIAGNOSIS — J301 Allergic rhinitis due to pollen: Secondary | ICD-10-CM | POA: Diagnosis not present

## 2021-01-14 DIAGNOSIS — J301 Allergic rhinitis due to pollen: Secondary | ICD-10-CM | POA: Diagnosis not present

## 2021-01-28 ENCOUNTER — Encounter: Payer: Self-pay | Admitting: Family Medicine

## 2021-01-28 ENCOUNTER — Telehealth (INDEPENDENT_AMBULATORY_CARE_PROVIDER_SITE_OTHER): Payer: BC Managed Care – PPO | Admitting: Family Medicine

## 2021-01-28 ENCOUNTER — Telehealth: Payer: BC Managed Care – PPO | Admitting: Family Medicine

## 2021-01-28 ENCOUNTER — Other Ambulatory Visit: Payer: Self-pay

## 2021-01-28 DIAGNOSIS — J019 Acute sinusitis, unspecified: Secondary | ICD-10-CM

## 2021-01-28 MED ORDER — DOXYCYCLINE HYCLATE 100 MG PO TABS: 100.0000 mg | ORAL_TABLET | Freq: Two times a day (BID) | ORAL | 0 refills | Status: AC

## 2021-01-28 NOTE — Progress Notes (Signed)
Chief Complaint  Patient presents with  . Cough    3 weeks, post nasal drip, head congestion    Allen Long here for URI complaints. Due to COVID-19 pandemic, we are interacting via web portal for an electronic face-to-face visit. I verified patient's ID using 2 identifiers. Patient agreed to proceed with visit via this method. Patient is at home, I am at office. Patient and I are present for visit.   Duration: 3 weeks  Associated symptoms: sinus headache, sinus congestion, rhinorrhea and cough Denies: sinus pain, itchy watery eyes, ear pain, ear drainage, sore throat, wheezing, shortness of breath, myalgia and fevers Treatment to date: Mucinex D, OTC cough medication  Sick contacts: No  Past Medical History:  Diagnosis Date  . Dysplastic nevus 10/06/2017   left mid back 4.0 cm lat to spine, right ant axilla  margins free   . Dysplastic nevus 11/30/2018   left low back paraspinal  . Dysplastic nevus 12/05/2019   right chest parasternal  margins free on 03/06/20  . Hypothyroid    Objective No conversational dyspnea Age appropriate judgment and insight Nml affect and mood  Acute sinusitis, recurrence not specified, unspecified location - Plan: doxycycline (VIBRA-TABS) 100 MG tablet  Will tx for bacterial infection covering both sinusitis and bacterial bronchitis. Send message in 2-3 d if no better and will consider 5 d pred burst.  Continue to push fluids, practice good hand hygiene, cover mouth when coughing. F/u prn. If starting to experience fevers, shaking, or shortness of breath, seek immediate care. Pt voiced understanding and agreement to the plan.  [place], DO 01/28/21 1:50 PM

## 2021-02-04 DIAGNOSIS — J301 Allergic rhinitis due to pollen: Secondary | ICD-10-CM | POA: Diagnosis not present

## 2021-02-11 DIAGNOSIS — J301 Allergic rhinitis due to pollen: Secondary | ICD-10-CM | POA: Diagnosis not present

## 2021-02-18 DIAGNOSIS — J301 Allergic rhinitis due to pollen: Secondary | ICD-10-CM | POA: Diagnosis not present

## 2021-02-25 DIAGNOSIS — J301 Allergic rhinitis due to pollen: Secondary | ICD-10-CM | POA: Diagnosis not present

## 2021-02-26 DIAGNOSIS — J301 Allergic rhinitis due to pollen: Secondary | ICD-10-CM | POA: Diagnosis not present

## 2021-03-11 DIAGNOSIS — J301 Allergic rhinitis due to pollen: Secondary | ICD-10-CM | POA: Diagnosis not present

## 2021-03-19 DIAGNOSIS — J301 Allergic rhinitis due to pollen: Secondary | ICD-10-CM | POA: Diagnosis not present

## 2021-04-08 DIAGNOSIS — J301 Allergic rhinitis due to pollen: Secondary | ICD-10-CM | POA: Diagnosis not present

## 2021-06-11 ENCOUNTER — Encounter: Payer: Self-pay | Admitting: Podiatry

## 2021-06-11 ENCOUNTER — Other Ambulatory Visit: Payer: Self-pay

## 2021-06-11 ENCOUNTER — Ambulatory Visit (INDEPENDENT_AMBULATORY_CARE_PROVIDER_SITE_OTHER): Payer: BC Managed Care – PPO

## 2021-06-11 ENCOUNTER — Ambulatory Visit: Payer: BC Managed Care – PPO

## 2021-06-11 ENCOUNTER — Ambulatory Visit (INDEPENDENT_AMBULATORY_CARE_PROVIDER_SITE_OTHER): Payer: BC Managed Care – PPO | Admitting: Podiatry

## 2021-06-11 DIAGNOSIS — M722 Plantar fascial fibromatosis: Secondary | ICD-10-CM

## 2021-06-11 NOTE — Progress Notes (Signed)
Subjective:  Patient ID: Allen Long, male    DOB: 09-20-1986,  MRN: [id number]  Chief Complaint  Patient presents with   Foot Pain    Right heel pain since July 4th     35 y.o. male presents with the above complaint.  Patient presents with right heel pain.  Patient states is going on since July 4.  He states that has progressive gotten worse.  He has post attic dyskinesia type of symptoms.  He has not seen anyone as prior to see me.  He is getting ready to run a marathon.  He denies any other acute complaints.  No like to discuss treatment options for it.  He is tried some over-the-counter remedies none of which has helped.   Review of Systems: Negative except as noted in the HPI. Denies N/V/F/Ch.  Past Medical History:  Diagnosis Date   Dysplastic nevus 10/06/2017   left mid back 4.0 cm lat to spine, right ant axilla  margins free    Dysplastic nevus 11/30/2018   left low back paraspinal   Dysplastic nevus 12/05/2019   right chest parasternal  margins free on 03/06/20   Hypothyroid     Current Outpatient Medications:    levocetirizine (XYZAL) 5 MG tablet, Take 5 mg by mouth daily. , Disp: , Rfl:    levothyroxine (SYNTHROID, LEVOTHROID) 175 MCG tablet, Take 175 mcg by mouth daily before breakfast. Mon - Sat, Disp: , Rfl:    Multiple Vitamin (MULTIVITAMIN) tablet, Take 1 tablet by mouth daily., Disp: , Rfl:    Omega-3 Fatty Acids (FISH OIL PO), Take 1 capsule by mouth daily. , Disp: , Rfl:    XHANCE 93 MCG/ACT EXHU, Place 1 spray into both nostrils 2 (two) times daily., Disp: , Rfl: 11  Social History   Tobacco Use  Smoking Status Never  Smokeless Tobacco Never    Allergies  Allergen Reactions   Penicillins     REACTION: Rash as a child (has had Augmentin and Keflex with NO reaction)   Objective:  There were no vitals filed for this visit. There is no height or weight on file to calculate BMI. Constitutional Well developed. Well nourished.  Vascular Dorsalis pedis  pulses palpable bilaterally. Posterior tibial pulses palpable bilaterally. Capillary refill normal to all digits.  No cyanosis or clubbing noted. Pedal hair growth normal.  Neurologic Normal speech. Oriented to person, place, and time. Epicritic sensation to light touch grossly present bilaterally.  Dermatologic Nails well groomed and normal in appearance. No open wounds. No skin lesions.  Orthopedic: Normal joint ROM without pain or crepitus bilaterally. No visible deformities. Tender to palpation at the calcaneal tuber right. No pain with calcaneal squeeze right. Ankle ROM diminished range of motion right. Silfverskiold Test: positive right.   Radiographs: Taken and reviewed. No acute fractures or dislocations. No evidence of stress fracture.  Plantar heel spur present. Posterior heel spur present.   Assessment:   1. Plantar fasciitis of right foot    Plan:  Patient was evaluated and treated and all questions answered.  Plantar Fasciitis, right - XR reviewed as above.  - Educated on icing and stretching. Instructions given.  - Injection delivered to the plantar fascia as below. - DME: Plantar Fascial Brace - Pharmacologic management: None  Procedure: Injection Tendon/Ligament Location: Right plantar fascia at the glabrous junction; medial approach. Skin Prep: alcohol Injectate: 0.5 cc 0.5% marcaine plain, 0.5 cc of 1% Lidocaine, 0.5 cc kenalog 10. Disposition: Patient tolerated procedure well. Injection  site dressed with a band-aid.  No follow-ups on file.

## 2021-07-11 ENCOUNTER — Ambulatory Visit (INDEPENDENT_AMBULATORY_CARE_PROVIDER_SITE_OTHER): Payer: BC Managed Care – PPO | Admitting: Podiatry

## 2021-07-11 ENCOUNTER — Other Ambulatory Visit: Payer: Self-pay

## 2021-07-11 DIAGNOSIS — M722 Plantar fascial fibromatosis: Secondary | ICD-10-CM

## 2021-07-11 DIAGNOSIS — Q667 Congenital pes cavus, unspecified foot: Secondary | ICD-10-CM

## 2021-07-11 NOTE — Progress Notes (Signed)
Subjective:  Patient ID: Allen Long, male    DOB: 01-Apr-1986,  MRN: [id number]  Chief Complaint  Patient presents with   Plantar Fasciitis    PT stated that he still has some pain     35 y.o. male presents with the above complaint.  Patient presents with follow-up to right Planter fasciitis.  Patient states injection helped a little bit.  He states is about 20 to 30% better.  He still feels pain when ambulating.  He would like to discuss next treatment plans.   Review of Systems: Negative except as noted in the HPI. Denies N/V/F/Ch.  Past Medical History:  Diagnosis Date   Dysplastic nevus 10/06/2017   left mid back 4.0 cm lat to spine, right ant axilla  margins free    Dysplastic nevus 11/30/2018   left low back paraspinal   Dysplastic nevus 12/05/2019   right chest parasternal  margins free on 03/06/20   Hypothyroid     Current Outpatient Medications:    levocetirizine (XYZAL) 5 MG tablet, Take 5 mg by mouth daily. , Disp: , Rfl:    levothyroxine (SYNTHROID, LEVOTHROID) 175 MCG tablet, Take 175 mcg by mouth daily before breakfast. Mon - Sat, Disp: , Rfl:    Multiple Vitamin (MULTIVITAMIN) tablet, Take 1 tablet by mouth daily., Disp: , Rfl:    Omega-3 Fatty Acids (FISH OIL PO), Take 1 capsule by mouth daily. , Disp: , Rfl:    XHANCE 93 MCG/ACT EXHU, Place 1 spray into both nostrils 2 (two) times daily., Disp: , Rfl: 11  Social History   Tobacco Use  Smoking Status Never  Smokeless Tobacco Never    Allergies  Allergen Reactions   Penicillins     REACTION: Rash as a child (has had Augmentin and Keflex with NO reaction)   Objective:  There were no vitals filed for this visit. There is no height or weight on file to calculate BMI. Constitutional Well developed. Well nourished.  Vascular Dorsalis pedis pulses palpable bilaterally. Posterior tibial pulses palpable bilaterally. Capillary refill normal to all digits.  No cyanosis or clubbing noted. Pedal hair growth  normal.  Neurologic Normal speech. Oriented to person, place, and time. Epicritic sensation to light touch grossly present bilaterally.  Dermatologic Nails well groomed and normal in appearance. No open wounds. No skin lesions.  Orthopedic: Normal joint ROM without pain or crepitus bilaterally. No visible deformities. Tender to palpation at the calcaneal tuber right. No pain with calcaneal squeeze right. Ankle ROM diminished range of motion right. Silfverskiold Test: positive right.   Radiographs: Taken and reviewed. No acute fractures or dislocations. No evidence of stress fracture.  Plantar heel spur present. Posterior heel spur present.   Assessment:   1. Plantar fasciitis of right foot   2. Pes cavus     Plan:  Patient was evaluated and treated and all questions answered.  Plantar Fasciitis, right - XR reviewed as above.  - Educated on icing and stretching. Instructions given.  -Second injection delivered to the plantar fascia as below. - DME: Plantar Fascial Brace - Pharmacologic management: None  Pes cavus -I explained the patient the etiology of pes cavus and worse treatment options were extensively discussed.  Given the setting of Planter fasciitis I believe patient will benefit from with tight plantar fascia I believe patient will benefit from custom-made orthotics to help control the hindfoot motion support the arches of his foot. -He was casted for orthotics  Procedure: Injection Tendon/Ligament Location: Right plantar fascia  at the glabrous junction; medial approach. Skin Prep: alcohol Injectate: 0.5 cc 0.5% marcaine plain, 0.5 cc of 1% Lidocaine, 0.5 cc kenalog 10. Disposition: Patient tolerated procedure well. Injection site dressed with a band-aid.  No follow-ups on file.

## 2021-08-08 ENCOUNTER — Encounter: Payer: Self-pay | Admitting: Podiatry

## 2021-08-08 ENCOUNTER — Ambulatory Visit (INDEPENDENT_AMBULATORY_CARE_PROVIDER_SITE_OTHER): Payer: BC Managed Care – PPO | Admitting: Podiatry

## 2021-08-08 ENCOUNTER — Other Ambulatory Visit: Payer: Self-pay

## 2021-08-08 DIAGNOSIS — M722 Plantar fascial fibromatosis: Secondary | ICD-10-CM

## 2021-08-08 NOTE — Progress Notes (Signed)
  Subjective:  Patient ID: Allen Long, male    DOB: 10/28/1986,  MRN: [id number]  Chief Complaint  Patient presents with   Plantar Fasciitis    "It's about the same."    35 y.o. male presents with the above complaint.  Patient presents with follow-up to right Planter fasciitis.  Patient states injection helped a little bit.  He states is about 20 to 30% better.  He still feels pain when ambulating.  He would like to discuss next treatment plans.   Review of Systems: Negative except as noted in the HPI. Denies N/V/F/Ch.  Past Medical History:  Diagnosis Date   Dysplastic nevus 10/06/2017   left mid back 4.0 cm lat to spine, right ant axilla  margins free    Dysplastic nevus 11/30/2018   left low back paraspinal   Dysplastic nevus 12/05/2019   right chest parasternal  margins free on 03/06/20   Hypothyroid     Current Outpatient Medications:    levocetirizine (XYZAL) 5 MG tablet, Take 5 mg by mouth daily. , Disp: , Rfl:    levothyroxine (SYNTHROID, LEVOTHROID) 175 MCG tablet, Take 175 mcg by mouth daily before breakfast. Mon - Sat, Disp: , Rfl:    Multiple Vitamin (MULTIVITAMIN) tablet, Take 1 tablet by mouth daily., Disp: , Rfl:    Omega-3 Fatty Acids (FISH OIL PO), Take 1 capsule by mouth daily. , Disp: , Rfl:    XHANCE 93 MCG/ACT EXHU, Place 1 spray into both nostrils 2 (two) times daily., Disp: , Rfl: 11  Social History   Tobacco Use  Smoking Status Never  Smokeless Tobacco Never    Allergies  Allergen Reactions   Penicillins     REACTION: Rash as a child (has had Augmentin and Keflex with NO reaction)   Objective:  There were no vitals filed for this visit. There is no height or weight on file to calculate BMI. Constitutional Well developed. Well nourished.  Vascular Dorsalis pedis pulses palpable bilaterally. Posterior tibial pulses palpable bilaterally. Capillary refill normal to all digits.  No cyanosis or clubbing noted. Pedal hair growth normal.   Neurologic Normal speech. Oriented to person, place, and time. Epicritic sensation to light touch grossly present bilaterally.  Dermatologic Nails well groomed and normal in appearance. No open wounds. No skin lesions.  Orthopedic: Normal joint ROM without pain or crepitus bilaterally. No visible deformities. Tender to palpation at the calcaneal tuber right. No pain with calcaneal squeeze right. Ankle ROM diminished range of motion right. Silfverskiold Test: positive right.   Radiographs: Taken and reviewed. No acute fractures or dislocations. No evidence of stress fracture.  Plantar heel spur present. Posterior heel spur present.   Assessment:   1. Plantar fasciitis of right foot      Plan:  Patient was evaluated and treated and all questions answered.  Plantar Fasciitis, right - XR reviewed as above.  - Educated on icing and stretching. Instructions given.  -I will hold off on any further injection as he has not benefited from them - DME: Cam boot - Pharmacologic management: None  Pes cavus -I explained the patient the etiology of pes cavus and worse treatment options were extensively discussed.  Given the setting of Planter fasciitis I believe patient will benefit from with tight plantar fascia I believe patient will benefit from custom-made orthotics to help control the hindfoot motion support the arches of his foot. -Orthotics were dispensed    No follow-ups on file.

## 2021-08-14 ENCOUNTER — Encounter: Payer: Self-pay | Admitting: Podiatry

## 2021-08-22 ENCOUNTER — Ambulatory Visit (INDEPENDENT_AMBULATORY_CARE_PROVIDER_SITE_OTHER): Payer: BC Managed Care – PPO | Admitting: Podiatry

## 2021-08-22 ENCOUNTER — Other Ambulatory Visit: Payer: Self-pay

## 2021-08-22 DIAGNOSIS — M722 Plantar fascial fibromatosis: Secondary | ICD-10-CM | POA: Diagnosis not present

## 2021-08-22 DIAGNOSIS — Z01818 Encounter for other preprocedural examination: Secondary | ICD-10-CM

## 2021-08-22 DIAGNOSIS — M21861 Other specified acquired deformities of right lower leg: Secondary | ICD-10-CM

## 2021-08-22 NOTE — Progress Notes (Signed)
Subjective:  Patient ID: Allen Long, male    DOB: 03/25/1986,  MRN: [id number]  Chief Complaint  Patient presents with   Plantar Fasciitis    Right foot plantar fasciitis  PT wants to discuss surgery     35 y.o. male presents with the above complaint.  Patient presents with follow-up of left Planter fasciitis.  Patient states that is still hurts has not gotten better the cam boot immobilization does not help much.  At this time he would like to discuss surgical options.  He has failed all conservative treatment options.  His pain scale is about the same.  He denies any other acute complaints   Review of Systems: Negative except as noted in the HPI. Denies N/V/F/Ch.  Past Medical History:  Diagnosis Date   Dysplastic nevus 10/06/2017   left mid back 4.0 cm lat to spine, right ant axilla  margins free    Dysplastic nevus 11/30/2018   left low back paraspinal   Dysplastic nevus 12/05/2019   right chest parasternal  margins free on 03/06/20   Hypothyroid     Current Outpatient Medications:    levocetirizine (XYZAL) 5 MG tablet, Take 5 mg by mouth daily. , Disp: , Rfl:    levothyroxine (SYNTHROID, LEVOTHROID) 175 MCG tablet, Take 175 mcg by mouth daily before breakfast. Mon - Sat, Disp: , Rfl:    Multiple Vitamin (MULTIVITAMIN) tablet, Take 1 tablet by mouth daily., Disp: , Rfl:    Omega-3 Fatty Acids (FISH OIL PO), Take 1 capsule by mouth daily. , Disp: , Rfl:    XHANCE 93 MCG/ACT EXHU, Place 1 spray into both nostrils 2 (two) times daily., Disp: , Rfl: 11  Social History   Tobacco Use  Smoking Status Never  Smokeless Tobacco Never    Allergies  Allergen Reactions   Penicillins     REACTION: Rash as a child (has had Augmentin and Keflex with NO reaction)   Objective:  There were no vitals filed for this visit. There is no height or weight on file to calculate BMI. Constitutional Well developed. Well nourished.  Vascular Dorsalis pedis pulses palpable  bilaterally. Posterior tibial pulses palpable bilaterally. Capillary refill normal to all digits.  No cyanosis or clubbing noted. Pedal hair growth normal.  Neurologic Normal speech. Oriented to person, place, and time. Epicritic sensation to light touch grossly present bilaterally.  Dermatologic Nails well groomed and normal in appearance. No open wounds. No skin lesions.  Orthopedic: Normal joint ROM without pain or crepitus bilaterally. No visible deformities. Tender to palpation at the calcaneal tuber right. No pain with calcaneal squeeze right. Ankle ROM diminished range of motion right. Silfverskiold Test: positive right.   Radiographs: Taken and reviewed. No acute fractures or dislocations. No evidence of stress fracture.  Plantar heel spur present. Posterior heel spur present.   Assessment:   1. Preoperative examination   2. Plantar fasciitis of right foot   3. Gastrocnemius equinus, right       Plan:  Patient was evaluated and treated and all questions answered.  Plantar Fasciitis, right with underlying gastrocnemius equinus -Clinically patient's pain has not improved even with cam boot immobilization.  At this time I discussed all the conservative treatment options including surgical options.  Given that patient has failed all conservative treatment options he will benefit from surgical options.  I discussed this with the patient and would like to proceed with surgery.  I discussed endoscopic plantar fasciotomy with gastrocnemius recession.  I discussed my  preoperative intraoperative and postoperative plan in extensive detail he states understanding would like to proceed with the surgery -Informed surgical risk consent was reviewed and read aloud to the patient.  I reviewed the films.  I have discussed my findings with the patient in great detail.  I have discussed all risks including but not limited to infection, stiffness, scarring, limp, disability, deformity, damage to  blood vessels and nerves, numbness, poor healing, need for braces, arthritis, chronic pain, amputation, death.  All benefits and realistic expectations discussed in great detail.  I have made no promises as to the outcome.  I have provided realistic expectations.  I have offered the patient a 2nd opinion, which they have declined and assured me they preferred to proceed despite the risks   Pes cavus -I explained the patient the etiology of pes cavus and worse treatment options were extensively discussed.  Given the setting of Planter fasciitis I believe patient will benefit from with tight plantar fascia I believe patient will benefit from custom-made orthotics to help control the hindfoot motion support the arches of his foot. -Orthotics were dispensed    No follow-ups on file.

## 2021-08-26 ENCOUNTER — Telehealth: Payer: Self-pay | Admitting: Urology

## 2021-08-26 NOTE — Telephone Encounter (Signed)
DOS - 09/16/21  EPF RIGHT --- [id number] GASTROCNEMIUS RECESS RIGHT --- [id number]   [employer] EFFECTIVE DATE - 03/25/21  SPOKE WITH RON AND HE STATED THAT FOR CPT CODES 20721 AND 82883 NO PRIOR AUTH IS REQUIRED.  REF # RON A. 08/26/21

## 2021-09-05 ENCOUNTER — Ambulatory Visit: Payer: BC Managed Care – PPO | Admitting: Podiatry

## 2021-09-16 ENCOUNTER — Other Ambulatory Visit: Payer: Self-pay | Admitting: Podiatry

## 2021-09-16 DIAGNOSIS — M216X2 Other acquired deformities of left foot: Secondary | ICD-10-CM | POA: Diagnosis not present

## 2021-09-16 DIAGNOSIS — M722 Plantar fascial fibromatosis: Secondary | ICD-10-CM | POA: Diagnosis not present

## 2021-09-16 MED ORDER — IBUPROFEN 800 MG PO TABS: 800.0000 mg | ORAL_TABLET | Freq: Four times a day (QID) | ORAL | 1 refills | Status: DC | PRN

## 2021-09-16 MED ORDER — OXYCODONE-ACETAMINOPHEN 5-325 MG PO TABS: 1.0000 | ORAL_TABLET | ORAL | 0 refills | Status: DC | PRN

## 2021-09-24 ENCOUNTER — Ambulatory Visit (INDEPENDENT_AMBULATORY_CARE_PROVIDER_SITE_OTHER): Payer: BC Managed Care – PPO | Admitting: Podiatry

## 2021-09-24 ENCOUNTER — Other Ambulatory Visit: Payer: Self-pay

## 2021-09-24 DIAGNOSIS — Z9889 Other specified postprocedural states: Secondary | ICD-10-CM

## 2021-09-24 DIAGNOSIS — M722 Plantar fascial fibromatosis: Secondary | ICD-10-CM

## 2021-09-24 DIAGNOSIS — M21861 Other specified acquired deformities of right lower leg: Secondary | ICD-10-CM

## 2021-09-24 NOTE — Progress Notes (Signed)
  Subjective:  Patient ID: Allen Long, male    DOB: [birth year],  MRN: [id number]  Chief Complaint  Patient presents with   Routine Post Op    POV #1 DOS 09/16/2021 RT EPF W/GASTROCNEMIUS RECESSION    DOS: 09/16/2021 Procedure: Right EPF with gastrocnemius recession  35 y.o. male returns for post-op check.  Patient states is doing well.  He has very minimal pain.  He has been ambulating with a cam boot.  He denies any other acute complaints.  Bandages clean dry and intact  Review of Systems: Negative except as noted in the HPI. Denies N/V/F/Ch.  Past Medical History:  Diagnosis Date   Dysplastic nevus 10/06/2017   left mid back 4.0 cm lat to spine, right ant axilla  margins free    Dysplastic nevus 11/30/2018   left low back paraspinal   Dysplastic nevus 12/05/2019   right chest parasternal  margins free on 03/06/20   Hypothyroid     Current Outpatient Medications:    ibuprofen (ADVIL) 800 MG tablet, Take 1 tablet (800 mg total) by mouth every 6 (six) hours as needed., Disp: 60 tablet, Rfl: 1   levocetirizine (XYZAL) 5 MG tablet, Take 5 mg by mouth daily. , Disp: , Rfl:    levothyroxine (SYNTHROID, LEVOTHROID) 175 MCG tablet, Take 175 mcg by mouth daily before breakfast. Mon - Sat, Disp: , Rfl:    Multiple Vitamin (MULTIVITAMIN) tablet, Take 1 tablet by mouth daily., Disp: , Rfl:    Omega-3 Fatty Acids (FISH OIL PO), Take 1 capsule by mouth daily. , Disp: , Rfl:    oxyCODONE-acetaminophen (PERCOCET) 5-325 MG tablet, Take 1 tablet by mouth every 4 (four) hours as needed for severe pain., Disp: 30 tablet, Rfl: 0   XHANCE 93 MCG/ACT EXHU, Place 1 spray into both nostrils 2 (two) times daily., Disp: , Rfl: 11  Social History   Tobacco Use  Smoking Status Never  Smokeless Tobacco Never    Allergies  Allergen Reactions   Penicillins     REACTION: Rash as a child (has had Augmentin and Keflex with NO reaction)   Objective:  There were no vitals filed for this visit. There is  no height or weight on file to calculate BMI. Constitutional Well developed. Well nourished.  Vascular Foot warm and well perfused. Capillary refill normal to all digits.   Neurologic Normal speech. Oriented to person, place, and time. Epicritic sensation to light touch grossly present bilaterally.  Dermatologic Skin healing well without signs of infection. Skin edges well coapted without signs of infection.  Orthopedic: Tenderness to palpation noted about the surgical site.   Radiographs: None Assessment:   1. Plantar fasciitis of right foot   2. Gastrocnemius equinus, right   3. Status post foot surgery    Plan:  Patient was evaluated and treated and all questions answered.  S/p foot surgery right -Progressing as expected post-operatively. -XR: None -WB Status: Weightbearing as tolerated in cam boot -Sutures: Intact.  No clinical signs of dehiscence.  No complication noted. -Medications: None status post foot surgery -Foot redressed.  No follow-ups on file.

## 2021-10-08 ENCOUNTER — Ambulatory Visit (INDEPENDENT_AMBULATORY_CARE_PROVIDER_SITE_OTHER): Payer: BC Managed Care – PPO | Admitting: Podiatry

## 2021-10-08 ENCOUNTER — Encounter: Payer: Self-pay | Admitting: Podiatry

## 2021-10-08 ENCOUNTER — Other Ambulatory Visit: Payer: Self-pay

## 2021-10-08 DIAGNOSIS — Z9889 Other specified postprocedural states: Secondary | ICD-10-CM

## 2021-10-08 DIAGNOSIS — M21861 Other specified acquired deformities of right lower leg: Secondary | ICD-10-CM

## 2021-10-08 DIAGNOSIS — M722 Plantar fascial fibromatosis: Secondary | ICD-10-CM

## 2021-10-08 NOTE — Progress Notes (Signed)
  Subjective:  Patient ID: Allen Long, male    DOB: 10-20-1986,  MRN: [id number]  Chief Complaint  Patient presents with   Routine Post Op     POV #2 DOS 09/16/2021 RT EPF W/GASTROCNEMIUS RECESSION    DOS: 09/16/2021 Procedure: Right EPF with gastrocnemius recession  35 y.o. male returns for post-op check.  Patient states is doing well.  He has very minimal pain.  He has been ambulating with a cam boot.  He denies any other acute complaints.  Bandages clean dry and intact  Review of Systems: Negative except as noted in the HPI. Denies N/V/F/Ch.  Past Medical History:  Diagnosis Date   Dysplastic nevus 10/06/2017   left mid back 4.0 cm lat to spine, right ant axilla  margins free    Dysplastic nevus 11/30/2018   left low back paraspinal   Dysplastic nevus 12/05/2019   right chest parasternal  margins free on 03/06/20   Hypothyroid     Current Outpatient Medications:    ibuprofen (ADVIL) 800 MG tablet, Take 1 tablet (800 mg total) by mouth every 6 (six) hours as needed., Disp: 60 tablet, Rfl: 1   levocetirizine (XYZAL) 5 MG tablet, Take 5 mg by mouth daily. , Disp: , Rfl:    levothyroxine (SYNTHROID, LEVOTHROID) 175 MCG tablet, Take 175 mcg by mouth daily before breakfast. Mon - Sat, Disp: , Rfl:    Multiple Vitamin (MULTIVITAMIN) tablet, Take 1 tablet by mouth daily., Disp: , Rfl:    Omega-3 Fatty Acids (FISH OIL PO), Take 1 capsule by mouth daily. , Disp: , Rfl:    oxyCODONE-acetaminophen (PERCOCET) 5-325 MG tablet, Take 1 tablet by mouth every 4 (four) hours as needed for severe pain., Disp: 30 tablet, Rfl: 0   XHANCE 93 MCG/ACT EXHU, Place 1 spray into both nostrils 2 (two) times daily., Disp: , Rfl: 11  Social History   Tobacco Use  Smoking Status Never  Smokeless Tobacco Never    Allergies  Allergen Reactions   Penicillins     REACTION: Rash as a child (has had Augmentin and Keflex with NO reaction)   Objective:  There were no vitals filed for this visit. There  is no height or weight on file to calculate BMI. Constitutional Well developed. Well nourished.  Vascular Foot warm and well perfused. Capillary refill normal to all digits.   Neurologic Normal speech. Oriented to person, place, and time. Epicritic sensation to light touch grossly present bilaterally.  Dermatologic Skin completely epithelialized.  No clinical signs of infection noted.  10 degrees of dorsiflexion past 90 noted.  No pain at the calcaneal tuber  Orthopedic: Now tenderness to palpation noted about the surgical site.   Radiographs: None Assessment:   1. Plantar fasciitis of right foot   2. Gastrocnemius equinus, right   3. Status post foot surgery     Plan:  Patient was evaluated and treated and all questions answered.  S/p foot surgery right -Progressing as expected post-operatively. -XR: None -WB Status: Weightbearing as tolerated in regular shoes -Sutures: Removed no clinical signs of dehiscence.  No complication noted. -Medications: None status post foot surgery -Physical therapy prescription was given  No follow-ups on file.

## 2021-11-05 ENCOUNTER — Other Ambulatory Visit: Payer: Self-pay

## 2021-11-05 ENCOUNTER — Ambulatory Visit (INDEPENDENT_AMBULATORY_CARE_PROVIDER_SITE_OTHER): Payer: BC Managed Care – PPO | Admitting: Podiatry

## 2021-11-05 DIAGNOSIS — M21861 Other specified acquired deformities of right lower leg: Secondary | ICD-10-CM

## 2021-11-05 DIAGNOSIS — Z9889 Other specified postprocedural states: Secondary | ICD-10-CM

## 2021-11-05 DIAGNOSIS — M722 Plantar fascial fibromatosis: Secondary | ICD-10-CM

## 2021-11-05 NOTE — Progress Notes (Signed)
Subjective:  Patient ID: Allen Long, male    DOB: [birth year],  MRN: [id number]  Chief Complaint  Patient presents with   Routine Post Op    POV #2 DOS 09/16/2021 RT EPF W/GASTROCNEMIUS RECESSION    DOS: 09/16/2021 Procedure: Right EPF with gastrocnemius recession  35 y.o. male returns for post-op check.  Patient states is doing well.  He does not have any pain.  He is doing physical therapy which is helpful.  He is noticing little bit of weakness to the right side which she is working on improving.  He has returned to regular shoes  Review of Systems: Negative except as noted in the HPI. Denies N/V/F/Ch.  Past Medical History:  Diagnosis Date   Dysplastic nevus 10/06/2017   left mid back 4.0 cm lat to spine, right ant axilla  margins free    Dysplastic nevus 11/30/2018   left low back paraspinal   Dysplastic nevus 12/05/2019   right chest parasternal  margins free on 03/06/20   Hypothyroid     Current Outpatient Medications:    ibuprofen (ADVIL) 800 MG tablet, Take 1 tablet (800 mg total) by mouth every 6 (six) hours as needed., Disp: 60 tablet, Rfl: 1   levocetirizine (XYZAL) 5 MG tablet, Take 5 mg by mouth daily. , Disp: , Rfl:    levothyroxine (SYNTHROID, LEVOTHROID) 175 MCG tablet, Take 175 mcg by mouth daily before breakfast. Mon - Sat, Disp: , Rfl:    Multiple Vitamin (MULTIVITAMIN) tablet, Take 1 tablet by mouth daily., Disp: , Rfl:    Omega-3 Fatty Acids (FISH OIL PO), Take 1 capsule by mouth daily. , Disp: , Rfl:    oxyCODONE-acetaminophen (PERCOCET) 5-325 MG tablet, Take 1 tablet by mouth every 4 (four) hours as needed for severe pain., Disp: 30 tablet, Rfl: 0   XHANCE 93 MCG/ACT EXHU, Place 1 spray into both nostrils 2 (two) times daily., Disp: , Rfl: 11  Social History   Tobacco Use  Smoking Status Never  Smokeless Tobacco Never    Allergies  Allergen Reactions   Penicillins     REACTION: Rash as a child (has had Augmentin and Keflex with NO reaction)    Objective:  There were no vitals filed for this visit. There is no height or weight on file to calculate BMI. Constitutional Well developed. Well nourished.  Vascular Foot warm and well perfused. Capillary refill normal to all digits.   Neurologic Normal speech. Oriented to person, place, and time. Epicritic sensation to light touch grossly present bilaterally.  Dermatologic Skin completely epithelialized.  No clinical signs of infection noted.  10 degrees of dorsiflexion past 90 noted.  No pain at the calcaneal tuber  Orthopedic: No known tenderness to palpation noted about the surgical site.   Radiographs: None Assessment:   1. Plantar fasciitis of right foot   2. Gastrocnemius equinus, right   3. Status post foot surgery      Plan:  Patient was evaluated and treated and all questions answered.  S/p foot surgery right -At this time patient has increase the range of motion of the ankle joint.  I discussed shoe gear modification orthotics with the patient and the importance of it.  At this time patient is officially discharged from my care as he has returned to normal gait.  If any foot and ankle issues were advised him to come see me.  He states understanding -Continue physical therapy as needed.  Return if symptoms worsen or fail to improve.

## 2021-12-12 ENCOUNTER — Encounter: Payer: BC Managed Care – PPO | Admitting: Dermatology

## 2022-03-10 ENCOUNTER — Ambulatory Visit: Payer: Self-pay | Admitting: Dermatology

## 2022-07-14 ENCOUNTER — Ambulatory Visit (INDEPENDENT_AMBULATORY_CARE_PROVIDER_SITE_OTHER): Payer: BC Managed Care – PPO | Admitting: Dermatology

## 2022-07-14 DIAGNOSIS — D229 Melanocytic nevi, unspecified: Secondary | ICD-10-CM | POA: Diagnosis not present

## 2022-07-14 DIAGNOSIS — Z1283 Encounter for screening for malignant neoplasm of skin: Secondary | ICD-10-CM

## 2022-07-14 DIAGNOSIS — D1800 Hemangioma unspecified site: Secondary | ICD-10-CM

## 2022-07-14 DIAGNOSIS — D171 Benign lipomatous neoplasm of skin and subcutaneous tissue of trunk: Secondary | ICD-10-CM | POA: Diagnosis not present

## 2022-07-14 DIAGNOSIS — D224 Melanocytic nevi of scalp and neck: Secondary | ICD-10-CM

## 2022-07-14 DIAGNOSIS — L578 Other skin changes due to chronic exposure to nonionizing radiation: Secondary | ICD-10-CM | POA: Diagnosis not present

## 2022-07-14 DIAGNOSIS — L814 Other melanin hyperpigmentation: Secondary | ICD-10-CM

## 2022-07-14 DIAGNOSIS — L821 Other seborrheic keratosis: Secondary | ICD-10-CM

## 2022-07-14 DIAGNOSIS — D1720 Benign lipomatous neoplasm of skin and subcutaneous tissue of unspecified limb: Secondary | ICD-10-CM

## 2022-07-14 DIAGNOSIS — Z86018 Personal history of other benign neoplasm: Secondary | ICD-10-CM

## 2022-07-14 DIAGNOSIS — L820 Inflamed seborrheic keratosis: Secondary | ICD-10-CM

## 2022-07-14 DIAGNOSIS — D489 Neoplasm of uncertain behavior, unspecified: Secondary | ICD-10-CM

## 2022-07-14 NOTE — Progress Notes (Unsigned)
Follow-Up Visit   Subjective  Allen Long is a 36 y.o. male who presents for the following: Annual Exam (1 year tbse, patient reports a spot at right side of scalp he would like checked and another mole at top of head he would like to discuss removal. Hx of dysplastic nevus, ). The patient presents for Total-Body Skin Exam (TBSE) for skin cancer screening and mole check.  The patient has spots, moles and lesions to be evaluated, some may be new or changing and the patient has concerns that these could be cancer.  The following portions of the chart were reviewed this encounter and updated as appropriate:  Tobacco  Allergies  Meds  Problems  Med Hx  Surg Hx  Fam Hx     Review of Systems: No other skin or systemic complaints except as noted in HPI or Assessment and Plan.  Objective  Well appearing patient in no apparent distress; mood and affect are within normal limits.  A full examination was performed including scalp, head, eyes, ears, nose, lips, neck, chest, axillae, abdomen, back, buttocks, bilateral upper extremities, bilateral lower extremities, hands, feet, fingers, toes, fingernails, and toenails. All findings within normal limits unless otherwise noted below.  right scalp 7 cm superior to ear 0.7 cm brown papule        trunk and extremities Multiple  nodules at trunk and extremities   Left Temple x 1 Erythematous stuck-on, waxy papule or plaque   Assessment & Plan  Neoplasm of uncertain behavior right scalp 7 cm superior to ear Epidermal / dermal shaving  Lesion diameter (cm):  0.7 Informed consent: discussed and consent obtained   Timeout: patient name, date of birth, surgical site, and procedure verified   Procedure prep:  Patient was prepped and draped in usual sterile fashion Prep type:  Isopropyl alcohol Anesthesia: the lesion was anesthetized in a standard fashion   Anesthetic:  1% lidocaine w/ epinephrine 1-100,000 buffered w/ 8.4%  NaHCO3 Instrument used: flexible razor blade   Hemostasis achieved with: pressure, aluminum chloride and electrodesiccation   Outcome: patient tolerated procedure well   Post-procedure details: sterile dressing applied and wound care instructions given   Dressing type: bandage and petrolatum    Specimen 1 - Surgical pathology Differential Diagnosis: Irritated nevus R/o dysplasia  Check Margins: No Irritated nevus R/o dysplasia   Lipoma of lower extremities trunk and arms Consistent with benign hereditary lipomatosis trunk and extremities Currently asymptomatic Benign, observe.   Inflamed seborrheic keratosis Left Temple x 1 Symptomatic, irritating, patient would like treated. Destruction of lesion - Left Temple x 1 Complexity: simple   Destruction method: cryotherapy   Informed consent: discussed and consent obtained   Timeout:  patient name, date of birth, surgical site, and procedure verified Lesion destroyed using liquid nitrogen: Yes   Region frozen until ice ball extended beyond lesion: Yes   Outcome: patient tolerated procedure well with no complications   Post-procedure details: wound care instructions given   Additional details:  Prior to procedure, discussed risks of blister formation, small wound, skin dyspigmentation, or rare scar following cryotherapy. Recommend Vaseline ointment to treated areas while healing.  Lentigines - Scattered tan macules - Due to sun exposure - Benign-appearing, observe - Recommend daily broad spectrum sunscreen SPF 30+ to sun-exposed areas, reapply every 2 hours as needed. - Call for any changes  Seborrheic Keratoses - Stuck-on, waxy, tan-brown papules and/or plaques  - Benign-appearing - Discussed benign etiology and prognosis. - Observe - Call for  any changes  Melanocytic Nevi - Tan-brown and/or pink-flesh-colored symmetric macules and papules - Benign appearing on exam today - Observation - Call clinic for new or changing  moles - Recommend daily use of broad spectrum spf 30+ sunscreen to sun-exposed areas.   Hemangiomas - Red papules - Discussed benign nature - Observe - Call for any changes  Actinic Damage - Chronic condition, secondary to cumulative UV/sun exposure - diffuse scaly erythematous macules with underlying dyspigmentation - Recommend daily broad spectrum sunscreen SPF 30+ to sun-exposed areas, reapply every 2 hours as needed.  - Staying in the shade or wearing long sleeves, sun glasses (UVA+UVB protection) and wide brim hats (4-inch brim around the entire circumference of the hat) are also recommended for sun protection.  - Call for new or changing lesions.  History of Dysplastic Nevi At multiple locations see history  - No evidence of recurrence today - Recommend regular full body skin exams - Recommend daily broad spectrum sunscreen SPF 30+ to sun-exposed areas, reapply every 2 hours as needed.  - Call if any new or changing lesions are noted between office visits Skin cancer screening performed today.  Return in about 1 year (around 07/15/2023) for TBSE.  IRuthell Rummage, CMA, am acting as scribe for Sarina Ser, MD. Documentation: I have reviewed the above documentation for accuracy and completeness, and I agree with the above.  Sarina Ser, MD

## 2022-07-14 NOTE — Patient Instructions (Addendum)
  Biopsy Wound Care Instructions  Leave the original bandage on for 24 hours if possible.  If the bandage becomes soaked or soiled before that time, it is OK to remove it and examine the wound.  A small amount of post-operative bleeding is normal.  If excessive bleeding occurs, remove the bandage, place gauze over the site and apply continuous pressure (no peeking) over the area for 30 minutes. If this does not work, please call our clinic as soon as possible or page your doctor if it is after hours.   Once a day, cleanse the wound with soap and water. It is fine to shower. If a thick crust develops you may use a Q-tip dipped into dilute hydrogen peroxide (mix 1:1 with water) to dissolve it.  Hydrogen peroxide can slow the healing process, so use it only as needed.    After washing, apply petroleum jelly (Vaseline) or an antibiotic ointment if your doctor prescribed one for you, followed by a bandage.    For best healing, the wound should be covered with a layer of ointment at all times. If you are not able to keep the area covered with a bandage to hold the ointment in place, this may mean re-applying the ointment several times a day.  Continue this wound care until the wound has healed and is no longer open.   Itching and mild discomfort is normal during the healing process. However, if you develop pain or severe itching, please call our office.   If you have any discomfort, you can take Tylenol (acetaminophen) or ibuprofen as directed on the bottle. (Please do not take these if you have an allergy to them or cannot take them for another reason).  Some redness, tenderness and white or yellow material in the wound is normal healing.  If the area becomes very sore and red, or develops a thick yellow-green material (pus), it may be infected; please notify us.    If you have stitches, return to clinic as directed to have the stitches removed. You will continue wound care for 2-3 days after the  stitches are removed.   Wound healing continues for up to one year following surgery. It is not unusual to experience pain in the scar from time to time during the interval.  If the pain becomes severe or the scar thickens, you should notify the office.    A slight amount of redness in a scar is expected for the first six months.  After six months, the redness will fade and the scar will soften and fade.  The color difference becomes less noticeable with time.  If there are any problems, return for a post-op surgery check at your earliest convenience.  To improve the appearance of the scar, you can use silicone scar gel, cream, or sheets (such as Mederma or Serica) every night for up to one year. These are available over the counter (without a prescription).  Please call our office at [phone] for any questions or concerns.      Seborrheic Keratosis  What causes seborrheic keratoses? Seborrheic keratoses are harmless, common skin growths that first appear during adult life.  As time goes by, more growths appear.  Some people may develop a large number of them.  Seborrheic keratoses appear on both covered and uncovered body parts.  They are not caused by sunlight.  The tendency to develop seborrheic keratoses can be inherited.  They vary in color from skin-colored to gray, brown, or even   black.  They can be either smooth or have a rough, warty surface.   Seborrheic keratoses are superficial and look as if they were stuck on the skin.  Under the microscope this type of keratosis looks like layers upon layers of skin.  That is why at times the top layer may seem to fall off, but the rest of the growth remains and re-grows.    Treatment Seborrheic keratoses do not need to be treated, but can easily be removed in the office.  Seborrheic keratoses often cause symptoms when they rub on clothing or jewelry.  Lesions can be in the way of shaving.  If they become inflamed, they can cause itching,  soreness, or burning.  Removal of a seborrheic keratosis can be accomplished by freezing, burning, or surgery. If any spot bleeds, scabs, or grows rapidly, please return to have it checked, as these can be an indication of a skin cancer.   Cryotherapy Aftercare  Wash gently with soap and water everyday.   Apply Vaseline and Band-Aid daily until healed.    Melanoma ABCDEs  Melanoma is the most dangerous type of skin cancer, and is the leading cause of death from skin disease.  You are more likely to develop melanoma if you: Have light-colored skin, light-colored eyes, or red or blond hair Spend a lot of time in the sun Tan regularly, either outdoors or in a tanning bed Have had blistering sunburns, especially during childhood Have a close family member who has had a melanoma Have atypical moles or large birthmarks  Early detection of melanoma is key since treatment is typically straightforward and cure rates are extremely high if we catch it early.   The first sign of melanoma is often a change in a mole or a new dark spot.  The ABCDE system is a way of remembering the signs of melanoma.  A for asymmetry:  The two halves do not match. B for border:  The edges of the growth are irregular. C for color:  A mixture of colors are present instead of an even brown color. D for diameter:  Melanomas are usually (but not always) greater than 6mm - the size of a pencil eraser. E for evolution:  The spot keeps changing in size, shape, and color.  Please check your skin once per month between visits. You can use a small mirror in front and a large mirror behind you to keep an eye on the back side or your body.   If you see any new or changing lesions before your next follow-up, please call to schedule a visit.  Please continue daily skin protection including broad spectrum sunscreen SPF 30+ to sun-exposed areas, reapplying every 2 hours as needed when you're outdoors.   Staying in the shade or  wearing long sleeves, sun glasses (UVA+UVB protection) and wide brim hats (4-inch brim around the entire circumference of the hat) are also recommended for sun protection.        Due to recent changes in healthcare laws, you may see results of your pathology and/or laboratory studies on MyChart before the doctors have had a chance to review them. We understand that in some cases there may be results that are confusing or concerning to you. Please understand that not all results are received at the same time and often the doctors may need to interpret multiple results in order to provide you with the best plan of care or course of treatment. Therefore, we ask that you   please give us 2 business days to thoroughly review all your results before contacting the office for clarification. Should we see a critical lab result, you will be contacted sooner.   If You Need Anything After Your Visit  If you have any questions or concerns for your doctor, please call our main line at [phone] and press option 4 to reach your doctor's medical assistant. If no one answers, please leave a voicemail as directed and we will return your call as soon as possible. Messages left after 4 pm will be answered the following business day.   You may also send us a message via MyChart. We typically respond to MyChart messages within 1-2 business days.  For prescription refills, please ask your pharmacy to contact our office. Our fax number is [phone].  If you have an urgent issue when the clinic is closed that cannot wait until the next business day, you can page your doctor at the number below.    Please note that while we do our best to be available for urgent issues outside of office hours, we are not available 24/7.   If you have an urgent issue and are unable to reach us, you may choose to seek medical care at your doctor's office, retail clinic, urgent care center, or emergency room.  If you have a medical  emergency, please immediately call 911 or go to the emergency department.  Pager Numbers  - Dr. Kowalski: [phone]  - Dr. Moye: [phone]  - Dr. Stewart: [phone]  In the event of inclement weather, please call our main line at [phone] for an update on the status of any delays or closures.  Dermatology Medication Tips: Please keep the boxes that topical medications come in in order to help keep track of the instructions about where and how to use these. Pharmacies typically print the medication instructions only on the boxes and not directly on the medication tubes.   If your medication is too expensive, please contact our office at [phone] option 4 or send us a message through MyChart.   We are unable to tell what your co-pay for medications will be in advance as this is different depending on your insurance coverage. However, we may be able to find a substitute medication at lower cost or fill out paperwork to get insurance to cover a needed medication.   If a prior authorization is required to get your medication covered by your insurance company, please allow us 1-2 business days to complete this process.  Drug prices often vary depending on where the prescription is filled and some pharmacies may offer cheaper prices.  The website www.goodrx.com contains coupons for medications through different pharmacies. The prices here do not account for what the cost may be with help from insurance (it may be cheaper with your insurance), but the website can give you the price if you did not use any insurance.  - You can print the associated coupon and take it with your prescription to the pharmacy.  - You may also stop by our office during regular business hours and pick up a GoodRx coupon card.  - If you need your prescription sent electronically to a different pharmacy, notify our office through [facility] MyChart or by phone at [phone] option 4.     Si Usted  Necesita Algo Despus de Su Visita  Tambin puede enviarnos un mensaje a travs de MyChart. Por lo general respondemos a los mensajes de MyChart en el transcurso de   1 a 2 das hbiles.  Para renovar recetas, por favor pida a su farmacia que se ponga en contacto con nuestra oficina. Nuestro nmero de fax es el [phone].  Si tiene un asunto urgente cuando la clnica est cerrada y que no puede esperar hasta el siguiente da hbil, puede llamar/localizar a su doctor(a) al nmero que aparece a continuacin.   Por favor, tenga en cuenta que aunque hacemos todo lo posible para estar disponibles para asuntos urgentes fuera del horario de oficina, no estamos disponibles las 24 horas del da, los 7 das de la semana.   Si tiene un problema urgente y no puede comunicarse con nosotros, puede optar por buscar atencin mdica  en el consultorio de su doctor(a), en una clnica privada, en un centro de atencin urgente o en una sala de emergencias.  Si tiene una emergencia mdica, por favor llame inmediatamente al 911 o vaya a la sala de emergencias.  Nmeros de bper  - Dr. Kowalski: [phone]  - Dra. Moye: [phone]  - Dra. Stewart: [phone]  En caso de inclemencias del tiempo, por favor llame a nuestra lnea principal al [phone] para una actualizacin sobre el estado de cualquier retraso o cierre.  Consejos para la medicacin en dermatologa: Por favor, guarde las cajas en las que vienen los medicamentos de uso tpico para ayudarle a seguir las instrucciones sobre dnde y cmo usarlos. Las farmacias generalmente imprimen las instrucciones del medicamento slo en las cajas y no directamente en los tubos del medicamento.   Si su medicamento es muy caro, por favor, pngase en contacto con nuestra oficina llamando al [phone] y presione la opcin 4 o envenos un mensaje a travs de MyChart.   No podemos decirle cul ser su copago por los medicamentos por adelantado ya que esto es  diferente dependiendo de la cobertura de su seguro. Sin embargo, es posible que podamos encontrar un medicamento sustituto a menor costo o llenar un formulario para que el seguro cubra el medicamento que se considera necesario.   Si se requiere una autorizacin previa para que su compaa de seguros cubra su medicamento, por favor permtanos de 1 a 2 das hbiles para completar este proceso.  Los precios de los medicamentos varan con frecuencia dependiendo del lugar de dnde se surte la receta y alguna farmacias pueden ofrecer precios ms baratos.  El sitio web www.goodrx.com tiene cupones para medicamentos de diferentes farmacias. Los precios aqu no tienen en cuenta lo que podra costar con la ayuda del seguro (puede ser ms barato con su seguro), pero el sitio web puede darle el precio si no utiliz ningn seguro.  - Puede imprimir el cupn correspondiente y llevarlo con su receta a la farmacia.  - Tambin puede pasar por nuestra oficina durante el horario de atencin regular y recoger una tarjeta de cupones de GoodRx.  - Si necesita que su receta se enve electrnicamente a una farmacia diferente, informe a nuestra oficina a travs de MyChart de [facility] o por telfono llamando al [phone] y presione la opcin 4.  

## 2022-07-15 ENCOUNTER — Telehealth: Payer: Self-pay

## 2022-07-15 ENCOUNTER — Encounter: Payer: Self-pay | Admitting: Dermatology

## 2022-07-15 NOTE — Telephone Encounter (Signed)
Advised pt of bx results/sh ?

## 2022-07-15 NOTE — Telephone Encounter (Signed)
-----   Message from Ralene Bathe, MD sent at 07/15/2022  5:01 PM EDT ----- Diagnosis Skin , right scalp 7cm superior to ear MELANOCYTIC NEVUS, INTRADERMAL TYPE, BASE INVOLVED  Benign mole No further treatment needed

## 2022-07-15 NOTE — Telephone Encounter (Signed)
Called patient, discussed pathology results. Patient voiced understanding. JP

## 2023-01-26 ENCOUNTER — Telehealth: Payer: BC Managed Care – PPO | Admitting: Nurse Practitioner

## 2023-01-26 DIAGNOSIS — J40 Bronchitis, not specified as acute or chronic: Secondary | ICD-10-CM

## 2023-01-26 MED ORDER — PREDNISONE 10 MG (21) PO TBPK: ORAL_TABLET | ORAL | 0 refills | Status: DC

## 2023-01-26 MED ORDER — BENZONATATE 100 MG PO CAPS: 100.0000 mg | ORAL_CAPSULE | Freq: Three times a day (TID) | ORAL | 0 refills | Status: DC | PRN

## 2023-01-26 NOTE — Progress Notes (Signed)
Virtual Visit Consent   Allen Long, you are scheduled for a virtual visit with a [facility] provider today. Just as with appointments in the office, your consent must be obtained to participate. Your consent will be active for this visit and any virtual visit you may have with one of our providers in the next 365 days. If you have a MyChart account, a copy of this consent can be sent to you electronically.  As this is a virtual visit, video technology does not allow for your provider to perform a traditional examination. This may limit your provider's ability to fully assess your condition. If your provider identifies any concerns that need to be evaluated in person or the need to arrange testing (such as labs, EKG, etc.), we will make arrangements to do so. Although advances in technology are sophisticated, we cannot ensure that it will always work on either your end or our end. If the connection with a video visit is poor, the visit may have to be switched to a telephone visit. With either a video or telephone visit, we are not always able to ensure that we have a secure connection.  By engaging in this virtual visit, you consent to the provision of healthcare and authorize for your insurance to be billed (if applicable) for the services provided during this visit. Depending on your insurance coverage, you may receive a charge related to this service.  I need to obtain your verbal consent now. Are you willing to proceed with your visit today? Allen Long has provided verbal consent on 01/26/2023 for a virtual visit (video or telephone). Apolonio Schneiders, FNP  Date: 01/26/2023 10:25 AM  Virtual Visit via Video Note   I, Apolonio Schneiders, connected with  Allen Long  ([id number], May 06, 1986) on 01/26/23 at 10:30 AM EDT by a video-enabled telemedicine application and verified that I am speaking with the correct person using two identifiers.  Location: Patient: Virtual Visit Location Patient:  Home Provider: Virtual Visit Location Provider: Home Office   I discussed the limitations of evaluation and management by telemedicine and the availability of in person appointments. The patient expressed understanding and agreed to proceed.    History of Present Illness: Allen Long is a 37 y.o. who identifies as a male who was assigned male at birth, and is being seen today for an ongoing cough.  He had a cold 2 weeks ago that has left him with a lingering dry cough  No fever  He denies a history of asthma   He nasal congestion has resolved   He has been using Delsym without relief  Also has been using Nyquil for relief   Denies any wheezing or tightness in chest   Has not taken a COVID test   Problems:  Patient Active Problem List   Diagnosis Date Noted   GIB (gastrointestinal bleeding) 04/10/2018   Generalized anxiety disorder 03/31/2018   Hypothyroid     Allergies:  Allergies  Allergen Reactions   Penicillins     REACTION: Rash as a child (has had Augmentin and Keflex with NO reaction)   Medications:  Current Outpatient Medications:    levocetirizine (XYZAL) 5 MG tablet, Take 5 mg by mouth daily. , Disp: , Rfl:    levothyroxine (SYNTHROID, LEVOTHROID) 175 MCG tablet, Take 175 mcg by mouth daily before breakfast. Mon - Sat, Disp: , Rfl:    Multiple Vitamin (MULTIVITAMIN) tablet, Take 1 tablet by mouth daily., Disp: , Rfl:  Omega-3 Fatty Acids (FISH OIL PO), Take 1 capsule by mouth daily. , Disp: , Rfl:    XHANCE 93 MCG/ACT EXHU, Place 1 spray into both nostrils 2 (two) times daily., Disp: , Rfl: 11  Observations/Objective: Patient is well-developed, well-nourished in no acute distress.  Resting comfortably  at home.  Head is normocephalic, atraumatic.  No labored breathing.  Speech is clear and coherent with logical content.  Patient is alert and oriented at baseline.    Assessment and Plan: 1. Bronchitis  - predniSONE (STERAPRED UNI-PAK 21 TAB) 10 MG (21)  TBPK tablet; Take 6 tablets on day one, 5 on day two, 4 on day three, 3 on day four, 2 on day five, and 1 on day six. Take with food.  Dispense: 21 tablet; Refill: 0 - benzonatate (TESSALON) 100 MG capsule; Take 1 capsule (100 mg total) by mouth 3 (three) times daily as needed.  Dispense: 30 capsule; Refill: 0     Follow Up Instructions: I discussed the assessment and treatment plan with the patient. The patient was provided an opportunity to ask questions and all were answered. The patient agreed with the plan and demonstrated an understanding of the instructions.  A copy of instructions were sent to the patient via MyChart unless otherwise noted below.    The patient was advised to call back or seek an in-person evaluation if the symptoms worsen or if the condition fails to improve as anticipated.  Time:  I spent 15 minutes with the patient via telehealth technology discussing the above problems/concerns.    Apolonio Schneiders, FNP

## 2023-01-31 ENCOUNTER — Telehealth: Payer: BC Managed Care – PPO | Admitting: Family Medicine

## 2023-01-31 DIAGNOSIS — J40 Bronchitis, not specified as acute or chronic: Secondary | ICD-10-CM

## 2023-01-31 MED ORDER — AZITHROMYCIN 250 MG PO TABS: ORAL_TABLET | ORAL | 0 refills | Status: AC

## 2023-01-31 NOTE — Progress Notes (Signed)
Virtual Visit Consent   Allen Long, you are scheduled for a virtual visit with a [facility] provider today. Just as with appointments in the office, your consent must be obtained to participate. Your consent will be active for this visit and any virtual visit you may have with one of our providers in the next 365 days. If you have a MyChart account, a copy of this consent can be sent to you electronically.  As this is a virtual visit, video technology does not allow for your provider to perform a traditional examination. This may limit your provider's ability to fully assess your condition. If your provider identifies any concerns that need to be evaluated in person or the need to arrange testing (such as labs, EKG, etc.), we will make arrangements to do so. Although advances in technology are sophisticated, we cannot ensure that it will always work on either your end or our end. If the connection with a video visit is poor, the visit may have to be switched to a telephone visit. With either a video or telephone visit, we are not always able to ensure that we have a secure connection.  By engaging in this virtual visit, you consent to the provision of healthcare and authorize for your insurance to be billed (if applicable) for the services provided during this visit. Depending on your insurance coverage, you may receive a charge related to this service.  I need to obtain your verbal consent now. Are you willing to proceed with your visit today? Allen Long has provided verbal consent on 01/31/2023 for a virtual visit (video or telephone). Dellia Nims, FNP  Date: 01/31/2023 4:01 PM  Virtual Visit via Video Note   I, Dellia Nims, connected with  Allen Long  ([id number], 07-10-1986) on 01/31/23 at  4:00 PM EDT by a video-enabled telemedicine application and verified that I am speaking with the correct person using two identifiers.  Location: Patient: Virtual Visit Location Patient: Home Provider:  Virtual Visit Location Provider: Home Office   I discussed the limitations of evaluation and management by telemedicine and the availability of in person appointments. The patient expressed understanding and agreed to proceed.    History of Present Illness: Allen Long is a 37 y.o. who identifies as a male who was assigned male at birth, and is being seen today for cough for 3 weeks persistent. Took a course of prednisone and cough meds just completed and cough is not improved. No fever, wheezing or sob. Marland Kitchen  HPI: HPI  Problems:  Patient Active Problem List   Diagnosis Date Noted   GIB (gastrointestinal bleeding) 04/10/2018   Generalized anxiety disorder 03/31/2018   Hypothyroid     Allergies:  Allergies  Allergen Reactions   Penicillins     REACTION: Rash as a child (has had Augmentin and Keflex with NO reaction)   Medications:  Current Outpatient Medications:    azithromycin (ZITHROMAX) 250 MG tablet, Take 2 tablets on day 1, then 1 tablet daily on days 2 through 5, Disp: 6 tablet, Rfl: 0   benzonatate (TESSALON) 100 MG capsule, Take 1 capsule (100 mg total) by mouth 3 (three) times daily as needed., Disp: 30 capsule, Rfl: 0   levocetirizine (XYZAL) 5 MG tablet, Take 5 mg by mouth daily. , Disp: , Rfl:    levothyroxine (SYNTHROID, LEVOTHROID) 175 MCG tablet, Take 175 mcg by mouth daily before breakfast. Mon - Sat, Disp: , Rfl:    Multiple Vitamin (MULTIVITAMIN) tablet,  Take 1 tablet by mouth daily., Disp: , Rfl:    Omega-3 Fatty Acids (FISH OIL PO), Take 1 capsule by mouth daily. , Disp: , Rfl:    predniSONE (STERAPRED UNI-PAK 21 TAB) 10 MG (21) TBPK tablet, Take 6 tablets on day one, 5 on day two, 4 on day three, 3 on day four, 2 on day five, and 1 on day six. Take with food., Disp: 21 tablet, Rfl: 0   XHANCE 93 MCG/ACT EXHU, Place 1 spray into both nostrils 2 (two) times daily., Disp: , Rfl: 11  Observations/Objective: Patient is well-developed, well-nourished in no acute  distress.  Resting comfortably  at home.  Head is normocephalic, atraumatic.  No labored breathing.  Speech is clear and coherent with logical content.  Patient is alert and oriented at baseline.    Assessment and Plan: 1. Bronchitis  Increase fluids, humidifier at night, tylenol or ibuprofen as directed, urgent care if sx persist/.   Follow Up Instructions: I discussed the assessment and treatment plan with the patient. The patient was provided an opportunity to ask questions and all were answered. The patient agreed with the plan and demonstrated an understanding of the instructions.  A copy of instructions were sent to the patient via MyChart unless otherwise noted below.     The patient was advised to call back or seek an in-person evaluation if the symptoms worsen or if the condition fails to improve as anticipated.  Time:  I spent 10 minutes with the patient via telehealth technology discussing the above problems/concerns.    Dellia Nims, FNP

## 2023-01-31 NOTE — Patient Instructions (Signed)

## 2023-04-07 ENCOUNTER — Encounter: Payer: Self-pay | Admitting: Urology

## 2023-04-07 ENCOUNTER — Ambulatory Visit (INDEPENDENT_AMBULATORY_CARE_PROVIDER_SITE_OTHER): Payer: BC Managed Care – PPO | Admitting: Urology

## 2023-04-07 VITALS — BP 156/108 | HR 63 | Ht 72.0 in | Wt 189.0 lb

## 2023-04-07 DIAGNOSIS — Z9852 Vasectomy status: Secondary | ICD-10-CM | POA: Diagnosis not present

## 2023-04-07 DIAGNOSIS — N50819 Testicular pain, unspecified: Secondary | ICD-10-CM

## 2023-04-07 NOTE — Progress Notes (Signed)
I,Amy L Pierron,acting as a scribe for Vanna Scotland, MD.,have documented all relevant documentation on the behalf of Vanna Scotland, MD,as directed by  Vanna Scotland, MD while in the presence of Vanna Scotland, MD.  04/07/2023 4:55 PM   Allen Long 1986/03/19 [id number]  Referring provider: Hannah Beat, MD [address] [place],  [place] [address]  Chief Complaint  Patient presents with   Testicle Pain    HPI: 37 year-old male with a personal history of a spermatocele presents today for a follow-up.  He underwent a vasectomy and right spermatocelectomy for a very small 1.5 centimeter spermatocele at the same time. He never followed up postoperatively or had a post-vasectomy semen analysis. Surgical pathology was benign.   He thinks he didn't return for a post-op appointment due to it being the start of Covid. He has not had any children since the procedure was done.   He reports having a feeling of heaviness in his left scrotum/perineum since last Monday. He thinks it has slight improvement each day but is not fully back to normal yet. He has not injured it but he rides his bike often, and has been for many years, including the Saturday before the discomfort first started.   Denies any urinary issues.   He has a bike event in 2 weeks.    PMH: Past Medical History:  Diagnosis Date   Dysplastic nevus 10/06/2017   left mid back 4.0 cm lat to spine, right ant axilla  margins free    Dysplastic nevus 11/30/2018   left low back paraspinal   Dysplastic nevus 12/05/2019   right chest parasternal  margins free on 03/06/20   Hypothyroid     Surgical History: Past Surgical History:  Procedure Laterality Date   APPENDECTOMY     FRONTAL SINUS EXPLORATION Bilateral 10/15/2017   Procedure: FRONTAL SINUS EXPLORATION;  Surgeon: Vernie Murders, MD;  [facility];  Service: ENT;  Laterality: Bilateral;   HYPOSPADIAS CORRECTION     as infant   IMAGE GUIDED  SINUS SURGERY Bilateral 10/15/2017   Procedure: IMAGE GUIDED SINUS SURGERY;  Surgeon: Vernie Murders, MD;  Location: [facility];  Service: ENT;  Laterality: Bilateral;   MASS EXCISION     scar tissue from prior surgery   NASAL SEPTOPLASTY W/ TURBINOPLASTY Bilateral 10/15/2017   Procedure: NASAL SEPTOPLASTY WITH TURBINATE REDUCTION;  Surgeon: Vernie Murders, MD;  Location: [facility];  Service: ENT;  Laterality: Bilateral;   SEPTOPLASTY WITH ETHMOIDECTOMY, AND MAXILLARY ANTROSTOMY Bilateral 10/15/2017   Procedure: SEPTOPLASTY WITH ETHMOIDECTOMY, AND MAXILLARY ANTROSTOMY;  Surgeon: Vernie Murders, MD;  Location: [facility];  Service: ENT;  Laterality: Bilateral;   SPERMATOCELECTOMY Right 10/01/2018   Procedure: Chinita Greenland;  Surgeon: Vanna Scotland, MD;  Location: [facility];  Service: Urology;  Laterality: Right;  vas kit needed   SPHENOIDECTOMY Bilateral 10/15/2017   Procedure: SPHENOIDECTOMY;  Surgeon: Vernie Murders, MD;  Location: [facility];  Service: ENT;  Laterality: Bilateral;   VASECTOMY Bilateral 10/01/2018   Procedure: Gabriela Eves;  Surgeon: Vanna Scotland, MD;  Location: [facility];  Service: Urology;  Laterality: Bilateral;    Home Medications:  Allergies as of 04/07/2023       Reactions   Penicillins    REACTION: Rash as a child (has had Augmentin and Keflex with NO reaction)        Medication List        Accurate as of Apr 07, 2023  4:55 PM. If  you have any questions, ask your nurse or doctor.          STOP taking these medications    benzonatate 100 MG capsule Commonly known as: TESSALON Stopped by: Vanna Scotland, MD   predniSONE 10 MG (21) Tbpk tablet Commonly known as: STERAPRED UNI-PAK 21 TAB Stopped by: Vanna Scotland, MD       TAKE these medications    FISH OIL PO Take 1 capsule by mouth daily.   levocetirizine 5 MG tablet Commonly known as: XYZAL Take 5 mg by mouth daily.    levothyroxine 175 MCG tablet Commonly known as: SYNTHROID Take 175 mcg by mouth daily before breakfast. Mon - Sat   multivitamin tablet Take 1 tablet by mouth daily.   Xhance 93 MCG/ACT Exhu Generic drug: Fluticasone Propionate Place 1 spray into both nostrils 2 (two) times daily.        Allergies:  Allergies  Allergen Reactions   Penicillins     REACTION: Rash as a child (has had Augmentin and Keflex with NO reaction)    Family History: Family History  Problem Relation Age of Onset   Prostate cancer Neg Hx    Bladder Cancer Neg Hx    Kidney cancer Neg Hx     Social History:  reports that he has never smoked. He has never used smokeless tobacco. He reports current alcohol use of about 3.0 standard drinks of alcohol per week. He reports that he does not use drugs.   Physical Exam: BP (!) 156/108   Pulse 63   Ht 6' (1.829 m)   Wt 189 lb (85.7 kg)   BMI 25.63 kg/m   Constitutional:  Alert and oriented, No acute distress. HEENT: [place] AT, moist mucus membranes.  Trachea midline, no masses. GU: Bilateral descended testicles, non-tender.  Normal perineum.  Normal phallus.  No inguinal hernias appreciated. Neurologic: Grossly intact, no focal deficits, moving all 4 extremities. Psychiatric: Normal mood and affect.  Results for orders placed or performed in visit on 04/07/23  Microscopic Examination   Urine  Result Value Ref Range   WBC, UA 0-5 0 - 5 /hpf   RBC, Urine None seen 0 - 2 /hpf   Epithelial Cells (non renal) 0-10 0 - 10 /hpf   Crystals Present (A) N/A   Crystal Type Amorphous Sediment N/A   Bacteria, UA Few None seen/Few  Urinalysis, Complete  Result Value Ref Range   Specific Gravity, UA 1.020 1.005 - 1.030   pH, UA 7.0 5.0 - 7.5   Color, UA Yellow Yellow   Appearance Ur Cloudy (A) Clear   Leukocytes,UA Negative Negative   Protein,UA Negative Negative/Trace   Glucose, UA Negative Negative   Ketones, UA Negative Negative   RBC, UA Negative Negative    Bilirubin, UA Negative Negative   Urobilinogen, Ur 0.2 0.2 - 1.0 mg/dL   Nitrite, UA Negative Negative   Microscopic Examination See below:      Assessment & Plan:    Testicular pain  -  Inflammatory Prostatitis versus chronic scrotal pain  -  Likely exacerbated by recent biking but he is improving. Continue to support care including NSAIDS, ice, tight undergarments, and avoidance of sitting on hard surfaces. Also discussed potentially a more ergonomic bike seat.  -Exam today is benign and reassuring  2. Status post vasectomy  - He never got his post semen analysis. Will get him set up to drop that off.   I have reviewed the above documentation for accuracy and  completeness, and I agree with the above.   Vanna Scotland, MD   [facility] [address], [address] [place], [place] [address] [phone]

## 2023-04-08 LAB — URINALYSIS, COMPLETE
Bilirubin, UA: NEGATIVE
Glucose, UA: NEGATIVE
Ketones, UA: NEGATIVE
Leukocytes,UA: NEGATIVE
Nitrite, UA: NEGATIVE
Protein,UA: NEGATIVE
RBC, UA: NEGATIVE
Specific Gravity, UA: 1.02 (ref 1.005–1.030)
Urobilinogen, Ur: 0.2 mg/dL (ref 0.2–1.0)
pH, UA: 7 (ref 5.0–7.5)

## 2023-04-08 LAB — MICROSCOPIC EXAMINATION: RBC, Urine: NONE SEEN /hpf (ref 0–2)

## 2023-04-10 ENCOUNTER — Other Ambulatory Visit: Payer: BC Managed Care – PPO

## 2023-04-10 DIAGNOSIS — N50819 Testicular pain, unspecified: Secondary | ICD-10-CM

## 2023-04-10 DIAGNOSIS — Z9852 Vasectomy status: Secondary | ICD-10-CM

## 2023-04-11 LAB — POST-VAS SPERM EVALUATION,QUAL: Volume: 5.9 mL

## 2023-08-04 ENCOUNTER — Ambulatory Visit (INDEPENDENT_AMBULATORY_CARE_PROVIDER_SITE_OTHER): Payer: BC Managed Care – PPO | Admitting: Dermatology

## 2023-08-04 ENCOUNTER — Encounter: Payer: Self-pay | Admitting: Dermatology

## 2023-08-04 VITALS — BP 150/76

## 2023-08-04 DIAGNOSIS — D224 Melanocytic nevi of scalp and neck: Secondary | ICD-10-CM

## 2023-08-04 DIAGNOSIS — Z1283 Encounter for screening for malignant neoplasm of skin: Secondary | ICD-10-CM | POA: Diagnosis not present

## 2023-08-04 DIAGNOSIS — W908XXA Exposure to other nonionizing radiation, initial encounter: Secondary | ICD-10-CM | POA: Diagnosis not present

## 2023-08-04 DIAGNOSIS — Z7189 Other specified counseling: Secondary | ICD-10-CM

## 2023-08-04 DIAGNOSIS — L814 Other melanin hyperpigmentation: Secondary | ICD-10-CM | POA: Diagnosis not present

## 2023-08-04 DIAGNOSIS — D171 Benign lipomatous neoplasm of skin and subcutaneous tissue of trunk: Secondary | ICD-10-CM

## 2023-08-04 DIAGNOSIS — Z86018 Personal history of other benign neoplasm: Secondary | ICD-10-CM

## 2023-08-04 DIAGNOSIS — D229 Melanocytic nevi, unspecified: Secondary | ICD-10-CM

## 2023-08-04 DIAGNOSIS — L578 Other skin changes due to chronic exposure to nonionizing radiation: Secondary | ICD-10-CM | POA: Diagnosis not present

## 2023-08-04 NOTE — Patient Instructions (Addendum)
Pre-Operative Instructions  You are scheduled for a surgical procedure at [facility]. We recommend you read the following instructions. If you have any questions or concerns, please call the office at [phone].  Shower and wash the entire body with soap and water the day of your surgery paying special attention to cleansing at and around the planned surgery site.  Avoid aspirin or aspirin containing products at least fourteen (14) days prior to your surgical procedure and for at least one week (7 Days) after your surgical procedure. If you take aspirin on a regular basis for heart disease or history of stroke or for any other reason, we may recommend you continue taking aspirin but please notify us if you take this on a regular basis. Aspirin can cause more bleeding to occur during surgery as well as prolonged bleeding and bruising after surgery.   Avoid other nonsteroidal pain medications at least one week prior to surgery and at least one week prior to your surgery. These include medications such as Ibuprofen (Motrin, Advil and Nuprin), Naprosyn, Voltaren, Relafen, etc. If medications are used for therapeutic reasons, please inform us as they can cause increased bleeding or prolonged bleeding during and bruising after surgical procedures.   Please advise [place] if you are taking any "blood thinner" medications such as Coumadin or Dipyridamole or Plavix or similar medications. These cause increased bleeding and prolonged bleeding during procedures and bruising after surgical procedures. We may have to consider discontinuing these medications briefly prior to and shortly after your surgery if safe to do so.   Please inform us of all medications you are currently taking. All medications that are taken regularly should be taken the day of surgery as you always do. Nevertheless, we need to be informed of what medications you are taking prior to surgery to know whether they will affect the  procedure or cause any complications.   Please inform us of any medication allergies. Also inform us of whether you have allergies to Latex or rubber products or whether you have had any adverse reaction to Lidocaine or Epinephrine.  Please inform us of any prosthetic or artificial body parts such as artificial heart valve, joint replacements, etc., or similar condition that might require preoperative antibiotics.   We recommend avoidance of alcohol at least two weeks prior to surgery and continued avoidance for at least two weeks after surgery.   We recommend discontinuation of tobacco smoking at least two weeks prior to surgery and continued abstinence for at least two weeks after surgery.  Do not plan strenuous exercise, strenuous work or strenuous lifting for approximately four weeks after your surgery.   We request if you are unable to make your scheduled surgical appointment, please call us at least a week in advance or as soon as you are aware of a problem so that we can cancel or reschedule the appointment.   You MAY TAKE TYLENOL (acetaminophen) for pain as it is not a blood thinner.   PLEASE PLAN TO BE IN TOWN FOR TWO WEEKS FOLLOWING SURGERY, THIS IS IMPORTANT SO YOU CAN BE CHECKED FOR DRESSING CHANGES, SUTURE REMOVAL AND TO MONITOR FOR POSSIBLE COMPLICATIONS.     Due to recent changes in healthcare laws, you may see results of your pathology and/or laboratory studies on MyChart before the doctors have had a chance to review them. We understand that in some cases there may be results that are confusing or concerning to you. Please understand that not all results  are received at the same time and often the doctors may need to interpret multiple results in order to provide you with the best plan of care or course of treatment. Therefore, we ask that you please give [place] 2 business days to thoroughly review all your results before contacting the office for clarification. Should we see a  critical lab result, you will be contacted sooner.   If You Need Anything After Your Visit  If you have any questions or concerns for your doctor, please call our main line at [phone] and press option 4 to reach your doctor's medical assistant. If no one answers, please leave a voicemail as directed and we will return your call as soon as possible. Messages left after 4 pm will be answered the following business day.   You may also send [place] a message via MyChart. We typically respond to MyChart messages within 1-2 business days.  For prescription refills, please ask your pharmacy to contact our office. Our fax number is [phone].  If you have an urgent issue when the clinic is closed that cannot wait until the next business day, you can page your doctor at the number below.    Please note that while we do our best to be available for urgent issues outside of office hours, we are not available 24/7.   If you have an urgent issue and are unable to reach [place], you may choose to seek medical care at your doctor's office, retail clinic, urgent care center, or emergency room.  If you have a medical emergency, please immediately call 911 or go to the emergency department.  Pager Numbers  - Dr. Gwen Pounds: [phone]  - Dr. Roseanne Reno: [phone]  - Dr. Katrinka Blazing: [phone]   In the event of inclement weather, please call our main line at [phone] for an update on the status of any delays or closures.  Dermatology Medication Tips: Please keep the boxes that topical medications come in in order to help keep track of the instructions about where and how to use these. Pharmacies typically print the medication instructions only on the boxes and not directly on the medication tubes.   If your medication is too expensive, please contact our office at [phone] option 4 or send [place] a message through MyChart.   We are unable to tell what your co-pay for medications will be in advance as  this is different depending on your insurance coverage. However, we may be able to find a substitute medication at lower cost or fill out paperwork to get insurance to cover a needed medication.   If a prior authorization is required to get your medication covered by your insurance company, please allow [place] 1-2 business days to complete this process.  Drug prices often vary depending on where the prescription is filled and some pharmacies may offer cheaper prices.  The website www.goodrx.com contains coupons for medications through different pharmacies. The prices here do not account for what the cost may be with help from insurance (it may be cheaper with your insurance), but the website can give you the price if you did not use any insurance.  - You can print the associated coupon and take it with your prescription to the pharmacy.  - You may also stop by our office during regular business hours and pick up a GoodRx coupon card.  - If you need your prescription sent electronically to a different pharmacy, notify our office through [facility] or by phone at [phone]  option 4.     Si Usted Necesita Algo Despus de Su Visita  Tambin puede enviarnos un mensaje a travs de Clinical cytogeneticist. Por lo general respondemos a los mensajes de MyChart en el transcurso de 1 a 2 das hbiles.  Para renovar recetas, por favor pida a su farmacia que se ponga en contacto con nuestra oficina. Annie Sable de fax es [place] [phone].  Si tiene un asunto urgente cuando la clnica est cerrada y que no puede esperar hasta el siguiente da hbil, puede llamar/localizar a su doctor(a) al nmero que aparece a continuacin.   Por favor, tenga en cuenta que aunque hacemos todo lo posible para estar disponibles para asuntos urgentes fuera del horario de [place], no estamos disponibles las 24 horas del da, los 7 [address]  [address] de la [place].   Si tiene un problema urgente y no puede comunicarse con nosotros, puede optar por  buscar atencin mdica  en el consultorio de su doctor(a), en una clnica privada, en un centro de atencin urgente o en una sala de emergencias.  Si tiene Engineer, drilling, por favor llame inmediatamente al 911 o vaya a la sala de emergencias.  Nmeros de bper  - Dr. Gwen Pounds: [phone]  - Dra. Roseanne Reno: [phone]  - Dr. Katrinka Blazing: [phone]   En caso de inclemencias del tiempo, por favor llame a Lacy Duverney principal al [phone] para una actualizacin sobre el [place] de cualquier retraso o cierre.  Consejos para la medicacin en dermatologa: Por favor, guarde las cajas en las que vienen los medicamentos de uso tpico para ayudarle a seguir las instrucciones sobre dnde y cmo usarlos. Las farmacias generalmente imprimen las instrucciones del medicamento slo en las cajas y no directamente en los tubos del [place].   Si su medicamento es muy caro, por favor, pngase en contacto con Rolm Gala llamando al [phone] y presione la opcin 4 o envenos un mensaje a travs de Clinical cytogeneticist.   No podemos decirle cul ser su copago por los medicamentos por adelantado ya que esto es diferente dependiendo de la cobertura de su seguro. Sin embargo, es posible que podamos encontrar un medicamento sustituto a Audiological scientist un formulario para que el seguro cubra el medicamento que se considera necesario.   Si se requiere una autorizacin previa para que su compaa de seguros [place] su medicamento, por favor permtanos de 1 a 2 das hbiles para completar [address]  Los precios de los medicamentos varan con frecuencia dependiendo del Environmental consultant de dnde se surte la receta y alguna farmacias pueden ofrecer precios ms baratos.  El sitio web www.goodrx.com tiene cupones para medicamentos de Health and safety inspector. Los precios aqu no tienen en cuenta lo que podra costar con la ayuda del seguro (puede ser ms barato con su seguro), pero el sitio web puede darle el precio si no  utiliz Tourist information centre manager.  - Puede imprimir el cupn correspondiente y llevarlo con su receta a la farmacia.  - Tambin puede pasar por nuestra oficina durante el horario de atencin regular y Education officer, museum una tarjeta de cupones de GoodRx.  - Si necesita que su receta se enve electrnicamente a una farmacia diferente, informe a nuestra oficina a travs de MyChart de [place] o por telfono llamando al [phone] y presione la opcin 4.

## 2023-08-04 NOTE — Progress Notes (Signed)
   Follow-Up Visit   Subjective  Allen Long is a 37 y.o. male who presents for the following: Skin Cancer Screening and Full Body Skin Exam, hx of Dysplastic Nevi The patient presents for Total-Body Skin Exam (TBSE) for skin cancer screening and mole check. The patient has spots, moles and lesions to be evaluated, some may be new or changing and the patient may have concern these could be cancer.  The following portions of the chart were reviewed this encounter and updated as appropriate: medications, allergies, medical history  Review of Systems:  No other skin or systemic complaints except as noted in HPI or Assessment and Plan.  Objective  Well appearing patient in no apparent distress; mood and affect are within normal limits.  A full examination was performed including scalp, head, eyes, ears, nose, lips, neck, chest, axillae, abdomen, back, buttocks, bilateral upper extremities, bilateral lower extremities, hands, feet, fingers, toes, fingernails, and toenails. All findings within normal limits unless otherwise noted below.   Relevant physical exam findings are noted in the Assessment and Plan.  L scalp supra auricular      Assessment & Plan   SKIN CANCER SCREENING PERFORMED TODAY.  ACTINIC DAMAGE - Chronic condition, secondary to cumulative UV/sun exposure - diffuse scaly erythematous macules with underlying dyspigmentation - Recommend daily broad spectrum sunscreen SPF 30+ to sun-exposed areas, reapply every 2 hours as needed.  - Staying in the shade or wearing long sleeves, sun glasses (UVA+UVB protection) and wide brim hats (4-inch brim around the entire circumference of the hat) are also recommended for sun protection.  - Call for new or changing lesions.  LENTIGINES, SEBORRHEIC KERATOSES, HEMANGIOMAS - Benign normal skin lesions - Benign-appearing - Call for any changes  MELANOCYTIC NEVI - Tan-brown and/or pink-flesh-colored symmetric macules and papules -  Benign appearing on exam today - Observation - Call clinic for new or changing moles - Recommend daily use of broad spectrum spf 30+ sunscreen to sun-exposed areas.  L scalp supra auricular sup 0.4cm reg brown pap L scalp supra auricular Inf 0.7cm regular brown macule   HISTORY OF DYSPLASTIC NEVUS x 4 No evidence of recurrence today Recommend regular full body skin exams Recommend daily broad spectrum sunscreen SPF 30+ to sun-exposed areas, reapply every 2 hours as needed.  Call if any new or changing lesions are noted between office visits  - L mid back 4.0cm lat to spine, R ant axilla, L low back paraspinal, R chest parasternal  Lipoma  Exam: Subcutaneous rubbery nodule(s) Location: L low back 5.0cm, R lower abdomen, discussed excising one at a time  Benign-appearing. Exam most consistent with a lipoma. Discussed that a lipoma is a benign fatty growth that can grow over time and sometimes get irritated. Recommend observation if it is not bothersome or changing. Discussed option of ILK injections or surgical excision to remove it if it is growing, symptomatic, or other changes noted. Please call for new or changing lesions so they can be evaluated.   Return in about 1 year (around 08/03/2024) for TBSE, Hx of Dysplastic nevi, Lipoma exc L low back.  I, Ardis Rowan, RMA, am acting as scribe for Armida Sans, MD .   Documentation: I have reviewed the above documentation for accuracy and completeness, and I agree with the above.  Armida Sans, MD

## 2023-10-27 ENCOUNTER — Ambulatory Visit: Payer: BC Managed Care – PPO | Admitting: Dermatology

## 2023-10-27 ENCOUNTER — Telehealth: Payer: Self-pay

## 2023-10-27 DIAGNOSIS — D492 Neoplasm of unspecified behavior of bone, soft tissue, and skin: Secondary | ICD-10-CM

## 2023-10-27 DIAGNOSIS — D171 Benign lipomatous neoplasm of skin and subcutaneous tissue of trunk: Secondary | ICD-10-CM

## 2023-10-27 DIAGNOSIS — D485 Neoplasm of uncertain behavior of skin: Secondary | ICD-10-CM

## 2023-10-27 MED ORDER — MUPIROCIN 2 % EX OINT: TOPICAL_OINTMENT | CUTANEOUS | 0 refills | Status: DC

## 2023-10-27 NOTE — Progress Notes (Signed)
   Follow-Up Visit   Subjective  Allen Long is a 37 y.o. male who presents for the following: Lipoma of the L mid back, pt is here today for excision.  The following portions of the chart were reviewed this encounter and updated as appropriate: medications, allergies, medical history  Review of Systems:  No other skin or systemic complaints except as noted in HPI or Assessment and Plan.  Objective  Well appearing patient in no apparent distress; mood and affect are within normal limits.   A focused examination was performed of the following areas: the back   Relevant exam findings are noted in the Assessment and Plan.  L low back Rubbery SQ nodule 6.0 x 4.0 cm   Assessment & Plan   NEOPLASM OF UNCERTAIN BEHAVIOR OF SKIN L low back Skin excision  Lesion length (cm):  6 Lesion width (cm):  4 Margin per side (cm):  0 Total excision diameter (cm):  6 Informed consent: discussed and consent obtained   Timeout: patient name, date of birth, surgical site, and procedure verified   Procedure prep:  Patient was prepped and draped in usual sterile fashion Prep type:  Isopropyl alcohol and povidone-iodine Anesthesia: the lesion was anesthetized in a standard fashion   Anesthesia comment:  12.0 cc lidocaine with epinephrine, 6 cc bupivocaine Anesthetic:  1% lidocaine w/ epinephrine 1-100,000 buffered w/ 8.4% NaHCO3 Hemostasis achieved with: pressure   Hemostasis achieved with comment:  Electrocautery Outcome: patient tolerated procedure well with no complications   Post-procedure details: sterile dressing applied and wound care instructions given   Dressing type: bandage and pressure dressing    Skin repair Complexity:  Complex Final length (cm):  3 Informed consent: discussed and consent obtained   Timeout: patient name, date of birth, surgical site, and procedure verified   Procedure prep:  Patient was prepped and draped in usual sterile fashion Prep type:   Povidone-iodine Anesthesia: the lesion was anesthetized in a standard fashion   Reason for type of repair: reduce tension to allow closure, reduce the risk of dehiscence, infection, and necrosis, reduce subcutaneous dead space and avoid a hematoma, allow closure of the large defect, preserve normal anatomy, preserve normal anatomical and functional relationships and enhance both functionality and cosmetic results   Undermining comment:  Undermining defect 6.0 cm Subcutaneous layers (deep stitches):  Suture size:  2-0 Suture type: Vicryl (polyglactin 910)   Fine/surface layer approximation (top stitches):  Suture size:  2-0 Stitches: simple running   Suture removal (days):  7 Hemostasis achieved with: suture and pressure Outcome: patient tolerated procedure well with no complications   Post-procedure details: sterile dressing applied and wound care instructions given   Dressing type: bandage and pressure dressing (Mupirocin 2% ointment)   Specimen 1 - Surgical pathology Differential Diagnosis: D48.5 r/o lipoma vs other Check Margins: No Start Mupirocin 2% ointment apply to aa QD.   Return in about 1 week (around 11/03/2023) for suture removal.  I, Cari Caraway, CMA, am acting as scribe for Armida Sans, MD .   Documentation: I have reviewed the above documentation for accuracy and completeness, and I agree with the above.  Armida Sans, MD

## 2023-10-27 NOTE — Telephone Encounter (Signed)
Patient doing well after today's surgery. Advised him to contact us if any issues.

## 2023-10-27 NOTE — Patient Instructions (Signed)

## 2023-10-29 LAB — SURGICAL PATHOLOGY

## 2023-10-30 ENCOUNTER — Encounter: Payer: Self-pay | Admitting: Dermatology

## 2023-11-03 ENCOUNTER — Ambulatory Visit: Payer: BC Managed Care – PPO | Admitting: Dermatology

## 2023-11-03 ENCOUNTER — Encounter: Payer: Self-pay | Admitting: Dermatology

## 2023-11-03 DIAGNOSIS — D171 Benign lipomatous neoplasm of skin and subcutaneous tissue of trunk: Secondary | ICD-10-CM

## 2023-11-03 NOTE — Patient Instructions (Signed)

## 2023-11-03 NOTE — Progress Notes (Signed)
   Follow-Up Visit   Subjective  Allen Long is a 37 y.o. male who presents for the following: Angiolipoma bx proven, pr presents for 1 wk f/u suture removal The patient has spots, moles and lesions to be evaluated, some may be new or changing and the patient may have concern these could be cancer.   The following portions of the chart were reviewed this encounter and updated as appropriate: medications, allergies, medical history  Review of Systems:  No other skin or systemic complaints except as noted in HPI or Assessment and Plan.  Objective  Well appearing patient in no apparent distress; mood and affect are within normal limits.   A focused examination was performed of the following areas: back  Relevant exam findings are noted in the Assessment and Plan.    Assessment & Plan   ANGIOLIPOMA, bx proven L low back Exam: healing excision site  Treatment Plan: Encounter for Removal of Sutures - Incision site at the L low back is clean, dry and intact - Wound cleansed, sutures removed, wound cleansed and steri strips applied.  - Discussed pathology results showing Angiolipoma  - Patient advised to keep steri-strips dry until they fall off. - Scars remodel for a full year. - Once steri-strips fall off, patient can apply over-the-counter silicone scar cream each night to help with scar remodeling if desired. - Patient advised to call with any concerns or if they notice any new or changing lesions.      Return for as scheduled for TBSE, Hx of Dysplastic nevi.  I, Ardis Rowan, RMA, am acting as scribe for Armida Sans, MD .   Documentation: I have reviewed the above documentation for accuracy and completeness, and I agree with the above.  Armida Sans, MD

## 2024-07-13 NOTE — Progress Notes (Signed)
 ENCOUNTER: Patient Class :No patient class for patient encounter Department: [facility] [address] [place] [address]  PATIENT: Patient Demographics      Name Patient ID SSN Gender Identity Birth Date   Long, Allen [id number] Male Oct 29, 1986 (38 yrs)          Address Phone Email       [address] [place] [address] [phone] (H) [email or web address]            [facility] Caucasian/White             Reg Status PCP Date Last Verified Next Review Date     ELAPSED Alla Amis, [phone] 03/30/24 04/29/24           Marital Status Religion Language       Widowed Unknown-Patient Declined English              EMERGENCY CONTACT: Name Relationship Lgl Grd Work Administrator, sports    GUARANTOR: There is no guarantor information entered for this encounter.  COVERAGE: Primary Visit Coverage      Payer Plan Group Number Group Name Payer Phone Plan Phone   No coverage found                Secondary Visit Coverage      Payer Plan Group Number Group Name Payer Phone Plan Phone   No coverage found                Primary Coverage      Payer Plan Group Number Group Name Payer Phone Plan Phone   [employer] [name] [id number] GARRY COO[place] [facility] OC30  [phone]           Primary Subscriber      Subscriber ID Subscriber Name Subscriber [facility] Subscriber Address   [id number] [name] [id number] [address]      [place], [place] [address]           Secondary Coverage      Payer Plan Group Number Group Name Payer Phone Plan Phone   No coverage found

## 2024-08-01 ENCOUNTER — Inpatient Hospital Stay

## 2024-08-01 ENCOUNTER — Encounter: Payer: Self-pay | Admitting: Oncology

## 2024-08-01 ENCOUNTER — Inpatient Hospital Stay: Attending: Oncology | Admitting: Oncology

## 2024-08-01 VITALS — BP 135/83 | HR 71 | Temp 97.6°F | Resp 16 | Wt 186.0 lb

## 2024-08-01 DIAGNOSIS — Z79899 Other long term (current) drug therapy: Secondary | ICD-10-CM | POA: Insufficient documentation

## 2024-08-01 DIAGNOSIS — D72819 Decreased white blood cell count, unspecified: Secondary | ICD-10-CM | POA: Diagnosis not present

## 2024-08-01 LAB — IRON AND TIBC
Iron: 115 ug/dL (ref 45–182)
Saturation Ratios: 30 % (ref 17.9–39.5)
TIBC: 381 ug/dL (ref 250–450)
UIBC: 266 ug/dL

## 2024-08-01 LAB — CBC (CANCER CENTER ONLY)
HCT: 44.9 % (ref 39.0–52.0)
Hemoglobin: 15.4 g/dL (ref 13.0–17.0)
MCH: 29.8 pg (ref 26.0–34.0)
MCHC: 34.3 g/dL (ref 30.0–36.0)
MCV: 87 fL (ref 80.0–100.0)
Platelet Count: 175 K/uL (ref 150–400)
RBC: 5.16 MIL/uL (ref 4.22–5.81)
RDW: 11.4 % — ABNORMAL LOW (ref 11.5–15.5)
WBC Count: 4 K/uL (ref 4.0–10.5)
nRBC: 0 % (ref 0.0–0.2)

## 2024-08-01 LAB — FERRITIN: Ferritin: 37 ng/mL (ref 24–336)

## 2024-08-01 LAB — FOLATE: Folate: >20 ng/mL (ref >5.9)

## 2024-08-01 LAB — VITAMIN B12: Vitamin B-12: 340 pg/mL (ref 180–914)

## 2024-08-01 NOTE — Progress Notes (Signed)
 [facility]  [phone] [phone]) [phone]  ID: Allen Long OB: February 08, 1986  MR#: [id number]  RDW#:[id number]  Patient Care Team: Watt Mirza, MD as PCP - General (Family Medicine)  CHIEF COMPLAINT: Leukopenia, unspecified.  INTERVAL HISTORY: Patient is a 39 year old male who was noted to have a persistent leukopenia on routine blood work since November 13, 2023.  He is referred for further evaluation.  He currently feels well and is asymptomatic.  He has no neurologic complaints.  He denies any recent fevers or illnesses.  He has a good appetite and denies weight loss.  He has no chest pain, shortness of breath, cough, or hemoptysis.  He denies any nausea, vomiting, constipation, or diarrhea.  He has no urinary complaints.  Patient offers no specific complaints today.   REVIEW OF SYSTEMS:   Review of Systems  Constitutional: Negative.  Negative for fever, malaise/fatigue and weight loss.  Respiratory: Negative.  Negative for cough, hemoptysis and shortness of breath.   Cardiovascular: Negative.  Negative for chest pain and leg swelling.  Gastrointestinal: Negative.  Negative for abdominal pain.  Genitourinary: Negative.  Negative for dysuria.  Musculoskeletal: Negative.  Negative for back pain.  Skin: Negative.  Negative for rash.  Neurological: Negative.  Negative for dizziness, focal weakness, weakness and headaches.  Psychiatric/Behavioral: Negative.  The patient is not nervous/anxious.     As per HPI. Otherwise, a complete review of systems is negative.  PAST MEDICAL HISTORY: Past Medical History:  Diagnosis Date   Dysplastic nevus 10/06/2017   left mid back 4.0 cm lat to spine, right ant axilla  margins free    Dysplastic nevus 11/30/2018   left low back paraspinal   Dysplastic nevus 12/05/2019   right chest parasternal  margins free on 03/06/20   Hypothyroid     PAST SURGICAL HISTORY: Past Surgical History:  Procedure Laterality  Date   APPENDECTOMY     FRONTAL SINUS EXPLORATION Bilateral 10/15/2017   Procedure: FRONTAL SINUS EXPLORATION;  Surgeon: Edda Mt, MD;  Location: [facility];  Service: ENT;  Laterality: Bilateral;   HYPOSPADIAS CORRECTION     as infant   IMAGE GUIDED SINUS SURGERY Bilateral 10/15/2017   Procedure: IMAGE GUIDED SINUS SURGERY;  Surgeon: Edda Mt, MD;  Location: [facility];  Service: ENT;  Laterality: Bilateral;   MASS EXCISION     scar tissue from prior surgery   NASAL SEPTOPLASTY W/ TURBINOPLASTY Bilateral 10/15/2017   Procedure: NASAL SEPTOPLASTY WITH TURBINATE REDUCTION;  Surgeon: Edda Mt, MD;  Location: [facility];  Service: ENT;  Laterality: Bilateral;   SEPTOPLASTY WITH ETHMOIDECTOMY, AND MAXILLARY ANTROSTOMY Bilateral 10/15/2017   Procedure: SEPTOPLASTY WITH ETHMOIDECTOMY, AND MAXILLARY ANTROSTOMY;  Surgeon: Edda Mt, MD;  Location: [facility];  Service: ENT;  Laterality: Bilateral;   SPERMATOCELECTOMY Right 10/01/2018   Procedure: FREDRICK;  Surgeon: Penne Knee, MD;  Location: [facility];  Service: Urology;  Laterality: Right;  vas kit needed   SPHENOIDECTOMY Bilateral 10/15/2017   Procedure: SPHENOIDECTOMY;  Surgeon: Edda Mt, MD;  Location: [facility];  Service: ENT;  Laterality: Bilateral;   VASECTOMY Bilateral 10/01/2018   Procedure: KATY;  Surgeon: Penne Knee, MD;  Location: [facility];  Service: Urology;  Laterality: Bilateral;    FAMILY HISTORY: Family History  Problem Relation Age of Onset   Prostate cancer Neg Hx    Bladder Cancer Neg Hx    Kidney cancer Neg Hx     ADVANCED DIRECTIVES (Y/N):  N  HEALTH MAINTENANCE: Social History   Tobacco Use   Smoking status: Never   Smokeless tobacco: Never  Vaping Use   Vaping status: Never Used  Substance Use Topics   Alcohol use: Yes    Alcohol/week: 3.0 standard drinks of alcohol    Types: 1 Glasses of wine,  2 Cans of beer per week   Drug use: No     Colonoscopy:  PAP:  Bone density:  Lipid panel:  Allergies  Allergen Reactions   Penicillins     REACTION: Rash as a child (has had Augmentin and Keflex with NO reaction)    Current Outpatient Medications  Medication Sig Dispense Refill   levothyroxine  (SYNTHROID , LEVOTHROID) 175 MCG tablet Take 175 mcg by mouth daily before breakfast. Mon - Sat     Multiple Vitamin (MULTIVITAMIN) tablet Take 1 tablet by mouth daily.     Omega-3 Fatty Acids (FISH OIL PO) Take 1 capsule by mouth daily.      levocetirizine (XYZAL ) 5 MG tablet Take 5 mg by mouth daily.      mupirocin  ointment (BACTROBAN ) 2 % Apply to wound QD. 22 g 0   XHANCE 93 MCG/ACT EXHU Place 1 spray into both nostrils 2 (two) times daily.  11   No current facility-administered medications for this visit.    OBJECTIVE: Vitals:   08/01/24 1127  BP: 135/83  Pulse: 71  Resp: 16  Temp: 97.6 F (36.4 C)  SpO2: 100%     Body mass index is 25.23 kg/m.    ECOG FS:0 - Asymptomatic  General: Well-developed, well-nourished, no acute distress. Eyes: Pink conjunctiva, anicteric sclera. HEENT: Normocephalic, moist mucous membranes. Lungs: No audible wheezing or coughing. Heart: Regular rate and rhythm. Abdomen: Soft, nontender, no obvious distention. Musculoskeletal: No edema, cyanosis, or clubbing. Neuro: Alert, answering all questions appropriately. Cranial nerves grossly intact. Skin: No rashes or petechiae noted. Psych: Normal affect. Lymphatics: No cervical, calvicular, axillary or inguinal LAD.   LAB RESULTS:  Lab Results  Component Value Date   NA 140 04/10/2018   K 4.0 04/10/2018   CL 105 04/10/2018   CO2 29 04/10/2018   GLUCOSE 103 (H) 04/10/2018   BUN 12 04/10/2018   CREATININE 0.95 04/10/2018   CALCIUM 8.7 (L) 04/10/2018   PROT 7.4 04/09/2018   ALBUMIN 4.7 04/09/2018   AST 21 04/09/2018   ALT 28 04/09/2018   ALKPHOS 77 04/09/2018   BILITOT 0.7 04/09/2018    GFRNONAA >60 04/10/2018   GFRAA >60 04/10/2018    Lab Results  Component Value Date   WBC 4.0 08/01/2024   NEUTROABS 6.5 09/07/2017   HGB 15.4 08/01/2024   HCT 44.9 08/01/2024   MCV 87.0 08/01/2024   PLT 175 08/01/2024     STUDIES: No results found.  ASSESSMENT: Leukopenia, unspecified.  PLAN:    Leukopenia, unspecified: Previously, patient noted to have a decreased total white blood cell count ranging between 2.8 and 3.2 since November 13, 2023.  Today's results is within normal limits at 4.0.  All of his other laboratory work including iron stores, B12, folate, neutrophil antibodies, and peripheral blood flow cytometry are all pending at time of dictation.  No intervention is needed.  Patient does not require bone marrow biopsy.  He will have a video-assisted telemedicine visit in 3 weeks to discuss the results.  I spent a total of 45 minutes reviewing chart data, face-to-face evaluation with the patient, counseling and coordination of care as detailed above.   Patient expressed understanding and  was in agreement with this plan. He also understands that He can call clinic at any time with any questions, concerns, or complaints.     Evalene JINNY Reusing, MD   08/01/2024 1:16 PM

## 2024-08-03 LAB — COMP PANEL: LEUKEMIA/LYMPHOMA

## 2024-08-09 LAB — NEUTROPHIL AB TEST LEVEL 1: NEUTROPHIL SCR/PANEL RESULT: POSITIVE — AB

## 2024-08-10 ENCOUNTER — Ambulatory Visit (INDEPENDENT_AMBULATORY_CARE_PROVIDER_SITE_OTHER): Payer: BC Managed Care – PPO | Admitting: Dermatology

## 2024-08-10 ENCOUNTER — Encounter: Payer: Self-pay | Admitting: Dermatology

## 2024-08-10 DIAGNOSIS — L814 Other melanin hyperpigmentation: Secondary | ICD-10-CM

## 2024-08-10 DIAGNOSIS — Z1283 Encounter for screening for malignant neoplasm of skin: Secondary | ICD-10-CM | POA: Diagnosis not present

## 2024-08-10 DIAGNOSIS — D485 Neoplasm of uncertain behavior of skin: Secondary | ICD-10-CM | POA: Diagnosis not present

## 2024-08-10 DIAGNOSIS — L578 Other skin changes due to chronic exposure to nonionizing radiation: Secondary | ICD-10-CM

## 2024-08-10 DIAGNOSIS — L820 Inflamed seborrheic keratosis: Secondary | ICD-10-CM | POA: Diagnosis not present

## 2024-08-10 DIAGNOSIS — D2272 Melanocytic nevi of left lower limb, including hip: Secondary | ICD-10-CM

## 2024-08-10 DIAGNOSIS — W908XXA Exposure to other nonionizing radiation, initial encounter: Secondary | ICD-10-CM

## 2024-08-10 DIAGNOSIS — D229 Melanocytic nevi, unspecified: Secondary | ICD-10-CM

## 2024-08-10 DIAGNOSIS — D224 Melanocytic nevi of scalp and neck: Secondary | ICD-10-CM

## 2024-08-10 DIAGNOSIS — L821 Other seborrheic keratosis: Secondary | ICD-10-CM

## 2024-08-10 DIAGNOSIS — D2271 Melanocytic nevi of right lower limb, including hip: Secondary | ICD-10-CM

## 2024-08-10 DIAGNOSIS — S90121A Contusion of right lesser toe(s) without damage to nail, initial encounter: Secondary | ICD-10-CM

## 2024-08-10 DIAGNOSIS — Z86018 Personal history of other benign neoplasm: Secondary | ICD-10-CM

## 2024-08-10 NOTE — Progress Notes (Signed)
 Follow-Up Visit   Subjective  Allen Long is a 38 y.o. male who presents for the following: Skin Cancer Screening and Full Body Skin Exam  The patient presents for Total-Body Skin Exam (TBSE) for skin cancer screening and mole check. The patient has spots, moles and lesions to be evaluated, some may be new or changing and the patient may have concern these could be cancer.  The following portions of the chart were reviewed this encounter and updated as appropriate: medications, allergies, medical history  Review of Systems:  No other skin or systemic complaints except as noted in HPI or Assessment and Plan.  Objective  Well appearing patient in no apparent distress; mood and affect are within normal limits.  A full examination was performed including scalp, head, eyes, ears, nose, lips, neck, chest, axillae, abdomen, back, buttocks, bilateral upper extremities, bilateral lower extremities, hands, feet, fingers, toes, fingernails, and toenails. All findings within normal limits unless otherwise noted below.   Relevant physical exam findings are noted in the Assessment and Plan.  Head - Anterior (Face) - R temple Erythematous stuck-on, waxy papule or plaque L inf lat popliteal 0.7 cm irregular brown macule.   Assessment & Plan   SKIN CANCER SCREENING PERFORMED TODAY.  ACTINIC DAMAGE - Chronic condition, secondary to cumulative UV/sun exposure - diffuse scaly erythematous macules with underlying dyspigmentation - Recommend daily broad spectrum sunscreen SPF 30+ to sun-exposed areas, reapply every 2 hours as needed.  - Staying in the shade or wearing long sleeves, sun glasses (UVA+UVB protection) and wide brim hats (4-inch brim around the entire circumference of the hat) are also recommended for sun protection.  - Call for new or changing lesions.  LENTIGINES, SEBORRHEIC KERATOSES, HEMANGIOMAS - Benign normal skin lesions - Benign-appearing - Call for any changes  MELANOCYTIC  NEVI           - L scalp/temple area two brown macules, stable compared to photos.  - R post calf brown macules - Tan-brown and/or pink-flesh-colored symmetric macules and papules - Benign appearing on exam today - Observation - Call clinic for new or changing moles - Recommend daily use of broad spectrum spf 30+ sunscreen to sun-exposed areas.   HISTORY OF DYSPLASTIC NEVI No evidence of recurrence today Recommend regular full body skin exams Recommend daily broad spectrum sunscreen SPF 30+ to sun-exposed areas, reapply every 2 hours as needed.  Call if any new or changing lesions are noted between office visits   Subungual hematoma -  R 2nd toenail, benign, due to trauma from running.   INFLAMED SEBORRHEIC KERATOSIS Head - Anterior (Face) - R temple Destruction of lesion - Head - Anterior (Face) - R temple Complexity: simple   Destruction method: cryotherapy   Informed consent: discussed and consent obtained   Timeout:  patient name, date of birth, surgical site, and procedure verified Lesion destroyed using liquid nitrogen: Yes   Region frozen until ice ball extended beyond lesion: Yes   Outcome: patient tolerated procedure well with no complications   Post-procedure details: wound care instructions given    NEOPLASM OF UNCERTAIN BEHAVIOR OF SKIN L inf lat popliteal Epidermal / dermal shaving  Lesion diameter (cm):  0.7 Informed consent: discussed and consent obtained   Timeout: patient name, date of birth, surgical site, and procedure verified   Procedure prep:  Patient was prepped and draped in usual sterile fashion Prep type:  Isopropyl alcohol Anesthesia: the lesion was anesthetized in a standard fashion   Anesthetic:  1% lidocaine  w/ epinephrine  1-100,000 buffered w/ 8.4% NaHCO3 Instrument used: flexible razor blade   Hemostasis achieved with: pressure, aluminum chloride and electrodesiccation   Outcome: patient tolerated procedure well   Post-procedure  details: sterile dressing applied and wound care instructions given   Dressing type: bandage (Mupirocin  2% ointment)    Specimen 1 - Surgical pathology Differential Diagnosis: D48.5 r/o dysplastic nevus Check Margins: Yes  Return in about 1 year (around 08/10/2025) for TBSE - hx dysplastic nevi.  LILLETTE Rosina Mayans, CMA, am acting as scribe for Alm Rhyme, MD .   Documentation: I have reviewed the above documentation for accuracy and completeness, and I agree with the above.  Alm Rhyme, MD

## 2024-08-10 NOTE — Patient Instructions (Addendum)

## 2024-08-11 LAB — SURGICAL PATHOLOGY

## 2024-08-16 ENCOUNTER — Ambulatory Visit: Payer: Self-pay | Admitting: Dermatology

## 2024-08-16 ENCOUNTER — Encounter: Payer: Self-pay | Admitting: Dermatology

## 2024-08-16 NOTE — Telephone Encounter (Addendum)
 Tried calling patient regarding results. No answer. LM for patient to return call.   ----- Message from Alm Rhyme sent at 08/16/2024 12:43 PM EDT ----- FINAL DIAGNOSIS        1. Skin, L inf lat popliteal :       DYSPLASTIC COMPOUND NEVUS WITH MODERATE ATYPIA, DEEP MARGIN INVOLVED   Moderate Dysplastic Recheck next visit ----- Message ----- From: Interface, Lab In Three Zero Seven Sent: 08/11/2024   4:09 PM EDT To: Alm JAYSON Rhyme, MD

## 2024-08-17 NOTE — Telephone Encounter (Addendum)
 Tried calling patient regarding results. No answer. Lm for patient to return call. ----- Message from Alm Rhyme sent at 08/16/2024 12:43 PM EDT ----- FINAL DIAGNOSIS        1. Skin, L inf lat popliteal :       DYSPLASTIC COMPOUND NEVUS WITH MODERATE ATYPIA, DEEP MARGIN INVOLVED   Moderate Dysplastic Recheck next visit ----- Message ----- From: Interface, Lab In Three Zero Seven Sent: 08/11/2024   4:09 PM EDT To: Alm JAYSON Rhyme, MD

## 2024-08-18 NOTE — Telephone Encounter (Signed)
 Patient advised of BX results. aw

## 2024-08-22 ENCOUNTER — Telehealth: Admitting: Oncology

## 2024-08-26 ENCOUNTER — Inpatient Hospital Stay: Attending: Oncology | Admitting: Oncology

## 2024-08-26 DIAGNOSIS — D72819 Decreased white blood cell count, unspecified: Secondary | ICD-10-CM

## 2024-08-26 NOTE — Progress Notes (Signed)
 [facility]  [phone]) [phone] Fax:([phone]) [phone]  ID: Allen Long OB: [birth year]  MR#: [id number]  RDW#:[id number]  Patient Care Team: Watt Mirza, MD as PCP - General (Family Medicine)  I connected with Allen Long on 08/26/24 at 11:15 AM EDT by video enabled telemedicine visit and verified that I am speaking with the correct person using two identifiers.   I discussed the limitations, risks, security and privacy concerns of performing an evaluation and management service by telemedicine and the availability of in-person appointments. I also discussed with the patient that there may be a patient responsible charge related to this service. The patient expressed understanding and agreed to proceed.   Other persons participating in the visit and their role in the encounter: Patient, MD.  Patient's location: Home. Provider's location: Clinic.  CHIEF COMPLAINT: Leukopenia, unspecified.  INTERVAL HISTORY: Patient agreed to video-assisted telemedicine visit for further evaluation and discussion of his laboratory results.  Allen Long continues to feel well and remains asymptomatic. Allen Long has no neurologic complaints.  Allen Long denies any recent fevers or illnesses.  Allen Long has a good appetite and denies weight loss.  Allen Long has no chest pain, shortness of breath, cough, or hemoptysis.  Allen Long denies any nausea, vomiting, constipation, or diarrhea.  Allen Long has no urinary complaints.  Patient offers no specific complaints today.  REVIEW OF SYSTEMS:   Review of Systems  Constitutional: Negative.  Negative for fever, malaise/fatigue and weight loss.  Respiratory: Negative.  Negative for cough, hemoptysis and shortness of breath.   Cardiovascular: Negative.  Negative for chest pain and leg swelling.  Gastrointestinal: Negative.  Negative for abdominal pain.  Genitourinary: Negative.  Negative for dysuria.  Musculoskeletal: Negative.  Negative for back pain.  Skin: Negative.  Negative for rash.   Neurological: Negative.  Negative for dizziness, focal weakness, weakness and headaches.  Psychiatric/Behavioral: Negative.  The patient is not nervous/anxious.     As per HPI. Otherwise, a complete review of systems is negative.  PAST MEDICAL HISTORY: Past Medical History:  Diagnosis Date   Dysplastic nevus 10/06/2017   left mid back 4.0 cm lat to spine, right ant axilla  margins free    Dysplastic nevus 11/30/2018   left low back paraspinal   Dysplastic nevus 12/05/2019   right chest parasternal  margins free on 03/06/20   Dysplastic nevus 08/09/2024   left inferior lateral popliteal - moderate atypia - will recheck at follow up   Hypothyroid     PAST SURGICAL HISTORY: Past Surgical History:  Procedure Laterality Date   APPENDECTOMY     FRONTAL SINUS EXPLORATION Bilateral 10/15/2017   Procedure: FRONTAL SINUS EXPLORATION;  Surgeon: Edda Mt, MD;  Location: [facility];  Service: ENT;  Laterality: Bilateral;   HYPOSPADIAS CORRECTION     as infant   IMAGE GUIDED SINUS SURGERY Bilateral 10/15/2017   Procedure: IMAGE GUIDED SINUS SURGERY;  Surgeon: Edda Mt, MD;  Location: [facility];  Service: ENT;  Laterality: Bilateral;   MASS EXCISION     scar tissue from prior surgery   NASAL SEPTOPLASTY W/ TURBINOPLASTY Bilateral 10/15/2017   Procedure: NASAL SEPTOPLASTY WITH TURBINATE REDUCTION;  Surgeon: Edda Mt, MD;  Location: [facility];  Service: ENT;  Laterality: Bilateral;   SEPTOPLASTY WITH ETHMOIDECTOMY, AND MAXILLARY ANTROSTOMY Bilateral 10/15/2017   Procedure: SEPTOPLASTY WITH ETHMOIDECTOMY, AND MAXILLARY ANTROSTOMY;  Surgeon: Edda Mt, MD;  Location: [facility];  Service: ENT;  Laterality: Bilateral;   SPERMATOCELECTOMY Right 10/01/2018   Procedure: SPERMATOCELECTOMY;  Surgeon: Penne Knee, MD;  Location: [facility];  Service: Urology;  Laterality: Right;  vas kit needed   SPHENOIDECTOMY Bilateral 10/15/2017    Procedure: SPHENOIDECTOMY;  Surgeon: Edda Mt, MD;  Location: [facility];  Service: ENT;  Laterality: Bilateral;   VASECTOMY Bilateral 10/01/2018   Procedure: KATY;  Surgeon: Penne Knee, MD;  Location: [facility];  Service: Urology;  Laterality: Bilateral;    FAMILY HISTORY: Family History  Problem Relation Age of Onset   Prostate cancer Neg Hx    Bladder Cancer Neg Hx    Kidney cancer Neg Hx     ADVANCED DIRECTIVES (Y/N):  N  HEALTH MAINTENANCE: Social History   Tobacco Use   Smoking status: Never   Smokeless tobacco: Never  Vaping Use   Vaping status: Never Used  Substance Use Topics   Alcohol use: Yes    Alcohol/week: 3.0 standard drinks of alcohol    Types: 1 Glasses of wine, 2 Cans of beer per week   Drug use: No     Colonoscopy:  PAP:  Bone density:  Lipid panel:  Allergies  Allergen Reactions   Penicillins     REACTION: Rash as a child (has had Augmentin and Keflex with NO reaction)    Current Outpatient Medications  Medication Sig Dispense Refill   levothyroxine  (SYNTHROID , LEVOTHROID) 175 MCG tablet Take 175 mcg by mouth daily before breakfast. Mon - Sat     Multiple Vitamin (MULTIVITAMIN) tablet Take 1 tablet by mouth daily.     Omega-3 Fatty Acids (FISH OIL PO) Take 1 capsule by mouth daily.      No current facility-administered medications for this visit.    OBJECTIVE: There were no vitals filed for this visit.    There is no height or weight on file to calculate BMI.    ECOG FS:0 - Asymptomatic  General: Well-developed, well-nourished, no acute distress. HEENT: Normocephalic. Neuro: Alert, answering all questions appropriately. Cranial nerves grossly intact. Psych: Normal affect.  LAB RESULTS:  Lab Results  Component Value Date   NA 140 04/10/2018   K 4.0 04/10/2018   CL 105 04/10/2018   CO2 29 04/10/2018   GLUCOSE 103 (H) 04/10/2018   BUN 12 04/10/2018   CREATININE 0.95 04/10/2018   CALCIUM 8.7 (L)  04/10/2018   PROT 7.4 04/09/2018   ALBUMIN 4.7 04/09/2018   AST 21 04/09/2018   ALT 28 04/09/2018   ALKPHOS 77 04/09/2018   BILITOT 0.7 04/09/2018   GFRNONAA >60 04/10/2018   GFRAA >60 04/10/2018    Lab Results  Component Value Date   WBC 4.0 08/01/2024   NEUTROABS 6.5 09/07/2017   HGB 15.4 08/01/2024   HCT 44.9 08/01/2024   MCV 87.0 08/01/2024   PLT 175 08/01/2024     STUDIES: No results found.  ASSESSMENT: Leukopenia, unspecified.  PLAN:    Leukopenia, unspecified: Previously, patient noted to have a decreased total white blood cell count ranging between 2.8 and 3.2 since November 13, 2023.  His most recent results are within normal limits at 4.0.  Patient noted to have positive neutrophil antibodies, but all of his other laboratory work was either negative or within normal limits.  Neutrophil antibodies can be transient in nature.  Given his normal white blood cell count, unclear the clinical significance.  No intervention is needed.  Patient does not require bone marrow biopsy.    I spent a total of 20 minutes reviewing chart data, face-to-face evaluation with the patient, counseling and  coordination of care as detailed above.  Patient expressed understanding and was in agreement with this plan. Allen Long also understands that Allen Long can call clinic at any time with any questions, concerns, or complaints.     Evalene JINNY Reusing, MD   08/26/2024 11:09 AM

## 2025-02-24 ENCOUNTER — Other Ambulatory Visit

## 2025-03-02 ENCOUNTER — Telehealth: Admitting: Oncology

## 2025-08-15 ENCOUNTER — Ambulatory Visit: Admitting: Dermatology
